# Patient Record
Sex: Male | Born: 1965 | Race: White | Hispanic: No | Marital: Married | Smoking: Former smoker
Health system: Southern US, Community
[De-identification: ages and names within clinical notes are randomized; demographics above are authoritative.]

## PROBLEM LIST (undated history)

## (undated) DIAGNOSIS — K759 Inflammatory liver disease, unspecified: Secondary | ICD-10-CM

## (undated) DIAGNOSIS — F419 Anxiety disorder, unspecified: Secondary | ICD-10-CM

## (undated) DIAGNOSIS — F4310 Post-traumatic stress disorder, unspecified: Secondary | ICD-10-CM

## (undated) DIAGNOSIS — I10 Essential (primary) hypertension: Secondary | ICD-10-CM

## (undated) DIAGNOSIS — M23311 Other meniscus derangements, anterior horn of medial meniscus, right knee: Secondary | ICD-10-CM

## (undated) DIAGNOSIS — R519 Headache, unspecified: Secondary | ICD-10-CM

## (undated) DIAGNOSIS — R51 Headache: Secondary | ICD-10-CM

## (undated) DIAGNOSIS — R7303 Prediabetes: Secondary | ICD-10-CM

## (undated) DIAGNOSIS — M797 Fibromyalgia: Secondary | ICD-10-CM

## (undated) HISTORY — PX: OTHER SURGICAL HISTORY: SHX169

## (undated) HISTORY — PX: CHOLECYSTECTOMY: SHX55

## (undated) HISTORY — PX: BACK SURGERY: SHX140

## (undated) HISTORY — PX: TONSILLECTOMY: SUR1361

## (undated) HISTORY — PX: HERNIA REPAIR: SHX51

---

## 1997-06-23 ENCOUNTER — Emergency Department (HOSPITAL_COMMUNITY): Admission: EM | Admit: 1997-06-23 | Discharge: 1997-06-23 | Payer: Self-pay | Admitting: Emergency Medicine

## 1997-08-24 ENCOUNTER — Emergency Department (HOSPITAL_COMMUNITY): Admission: EM | Admit: 1997-08-24 | Discharge: 1997-08-24 | Payer: Self-pay | Admitting: Emergency Medicine

## 1997-09-07 ENCOUNTER — Emergency Department (HOSPITAL_COMMUNITY): Admission: EM | Admit: 1997-09-07 | Discharge: 1997-09-07 | Payer: Self-pay | Admitting: Emergency Medicine

## 1999-01-14 ENCOUNTER — Encounter: Payer: Self-pay | Admitting: Family Medicine

## 1999-01-14 ENCOUNTER — Encounter: Admission: RE | Admit: 1999-01-14 | Discharge: 1999-01-14 | Payer: Self-pay | Admitting: Family Medicine

## 1999-04-01 ENCOUNTER — Encounter: Admission: RE | Admit: 1999-04-01 | Discharge: 1999-04-01 | Payer: Self-pay | Admitting: Internal Medicine

## 1999-04-29 ENCOUNTER — Encounter: Payer: Self-pay | Admitting: Emergency Medicine

## 1999-04-29 ENCOUNTER — Emergency Department (HOSPITAL_COMMUNITY): Admission: EM | Admit: 1999-04-29 | Discharge: 1999-04-29 | Payer: Self-pay | Admitting: Emergency Medicine

## 2002-01-30 ENCOUNTER — Encounter: Admission: RE | Admit: 2002-01-30 | Discharge: 2002-04-30 | Payer: Self-pay

## 2002-06-20 ENCOUNTER — Encounter
Admission: RE | Admit: 2002-06-20 | Discharge: 2002-09-18 | Payer: Self-pay | Admitting: Physical Medicine & Rehabilitation

## 2002-07-07 ENCOUNTER — Encounter
Admission: RE | Admit: 2002-07-07 | Discharge: 2002-09-29 | Payer: Self-pay | Admitting: Physical Medicine & Rehabilitation

## 2002-12-04 ENCOUNTER — Encounter: Admission: RE | Admit: 2002-12-04 | Discharge: 2002-12-04 | Payer: Self-pay | Admitting: Family Medicine

## 2002-12-04 ENCOUNTER — Encounter: Payer: Self-pay | Admitting: Family Medicine

## 2003-12-05 ENCOUNTER — Emergency Department (HOSPITAL_COMMUNITY): Admission: EM | Admit: 2003-12-05 | Discharge: 2003-12-06 | Payer: Self-pay

## 2003-12-09 ENCOUNTER — Ambulatory Visit (HOSPITAL_COMMUNITY): Admission: RE | Admit: 2003-12-09 | Discharge: 2003-12-09 | Payer: Self-pay | Admitting: Orthopaedic Surgery

## 2003-12-31 ENCOUNTER — Emergency Department (HOSPITAL_COMMUNITY): Admission: EM | Admit: 2003-12-31 | Discharge: 2003-12-31 | Payer: Self-pay | Admitting: Emergency Medicine

## 2004-01-04 ENCOUNTER — Ambulatory Visit (HOSPITAL_COMMUNITY): Admission: RE | Admit: 2004-01-04 | Discharge: 2004-01-05 | Payer: Self-pay | Admitting: Orthopaedic Surgery

## 2004-03-01 ENCOUNTER — Emergency Department (HOSPITAL_COMMUNITY): Admission: EM | Admit: 2004-03-01 | Discharge: 2004-03-01 | Payer: Self-pay | Admitting: Emergency Medicine

## 2004-03-01 ENCOUNTER — Ambulatory Visit (HOSPITAL_COMMUNITY): Admission: RE | Admit: 2004-03-01 | Discharge: 2004-03-01 | Payer: Self-pay | Admitting: Orthopaedic Surgery

## 2004-03-07 ENCOUNTER — Encounter: Admission: RE | Admit: 2004-03-07 | Discharge: 2004-03-31 | Payer: Self-pay | Admitting: Orthopaedic Surgery

## 2004-03-24 ENCOUNTER — Emergency Department (HOSPITAL_COMMUNITY): Admission: EM | Admit: 2004-03-24 | Discharge: 2004-03-24 | Payer: Self-pay | Admitting: Emergency Medicine

## 2006-06-29 ENCOUNTER — Encounter: Admission: RE | Admit: 2006-06-29 | Discharge: 2006-06-29 | Payer: Self-pay | Admitting: Orthopedic Surgery

## 2008-12-23 ENCOUNTER — Ambulatory Visit (HOSPITAL_COMMUNITY): Admission: RE | Admit: 2008-12-23 | Discharge: 2008-12-23 | Payer: Self-pay | Admitting: Psychiatry

## 2008-12-28 ENCOUNTER — Other Ambulatory Visit (HOSPITAL_COMMUNITY): Admission: RE | Admit: 2008-12-28 | Discharge: 2009-01-13 | Payer: Self-pay | Admitting: Psychiatry

## 2008-12-30 ENCOUNTER — Ambulatory Visit: Payer: Self-pay | Admitting: Psychiatry

## 2010-02-04 ENCOUNTER — Ambulatory Visit (HOSPITAL_BASED_OUTPATIENT_CLINIC_OR_DEPARTMENT_OTHER)
Admission: RE | Admit: 2010-02-04 | Discharge: 2010-02-04 | Payer: Self-pay | Source: Home / Self Care | Attending: Family Medicine | Admitting: Family Medicine

## 2010-07-08 NOTE — Op Note (Signed)
Frank Green, Frank Green               ACCOUNT NO.:  [id number]   MEDICAL RECORD NO.:  [id number]          PATIENT TYPE:  OIB   LOCATION:  5005                         FACILITY:  [facility]   PHYSICIAN:  Mark C. Ophelia Charter, M.D.    DATE OF BIRTH:  01-11-1966   DATE OF PROCEDURE:  01/04/2004  DATE OF DISCHARGE:                                 OPERATIVE REPORT   PREOPERATIVE DIAGNOSIS:  Right L4-L5 herniated nucleus pulposus.   POSTOPERATIVE DIAGNOSIS:  Right L4-L5 herniated nucleus pulposus.   PROCEDURE:  Right L4-L5 microdiscectomy and foraminotomy.   SURGEON:  Mark C. Ophelia Charter, M.D.   ASSISTANT:  Sandrea Matte, P.A.-C.   ANESTHESIA:  GOT.   ESTIMATED BLOOD LOSS:  50 mL.   BRIEF HISTORY:  This 45 year old male has had several year history of back  pain and right leg pain.  MRI scan has shown HNP on the right with  degeneration of the 3-4 and 4-5 disc with no compression at the 3-4 level.  He did have some mild central stenosis.  The patient has had right leg pain  only without weakness.   PROCEDURE:  After induction of general anesthesia and orotracheal  intubation, the patient was placed on the Andrews frame, preoperative Ancef  prophylaxis.  The back was prepared with the clippers, shaving the surface  hair.  The area was prepped with DuraPrep, squared with towels, Betadine and  Vi-Drape applied.  Laminectomy sheets and drapes and cross table lateral x-  ray with needle localization at the 4-0 space confirming this was the  appropriate level.  An incision was made 2-3 mm to the right of the midline  directly at the level of the needle with a 5 cm incision.  Subperiosteal  dissection of the lamina was performed.  There was a wide space between the  4 and 5 lamina with a thick interlaminar ligament present.  This was incised  with a scalpel, paddies to protect the dura, and Kerrison rongeur used to  remove the ligament, removed some lateral bone out to the pedicle, enlarge  the foramina.   The disc was not initially visualized in the gutter due to  some displacement of the disc laterally.  There were some epidural veins  which were coagulated and the nerve root was found scarred down to the disc.  The patient had not had epidural steroids and this may have been from his  repetitive bouts of back pain or possibly an epidural vein may have broken  at some point.  In any event, there was scar tissue that required releasing,  this was done with a dural separator, gently immobilizing the nerve root  until it could be pulled toward the midline.  The disc appeared relatively  flat and the annulus was incised.  A small bit of disc material was removed  with the micropituitary and then regular pituitary was inserted and one  large fragment which was 2 by 3 cm was gradually teased out of the small  incision.  A second pituitary had to be used to gradually tease it out  through the small  annular incision.  Once it was pulled out and set down,  there was good freedom of the nerve, it could easily be pulled toward the  midline.  The anterior portion of the epidural space was flat.  Passes were  made with an Epstein curet.  Up and down micropituitaries, straight  pituitary, but once the large piece was removed, there was minimal residual  disc left that was pathologic.  Inspection around the nerve root was  carefully performed, there were no fragments out the  foramina and no free fragments.  After irrigation with saline solution, the  fascia was closed with 0 Vicryl, 2-0 Vicryl in the subcutaneous tissue, skin  staple closure.  Postop dressing was applied.  The patient was transported  to the recovery room in stable condition.  Instrument count and needle  counts were correct.       MCY/MEDQ  D:  01/04/2004  T:  01/04/2004  Job:  [id number]

## 2010-07-08 NOTE — Consult Note (Signed)
NAME:  ARMISTEAD, SULT NO.:  [id number]   MEDICAL RECORD NO.:  [id number]                   PATIENT TYPE:  REC   LOCATION:  TPC                                  FACILITY:  [facility]   PHYSICIAN:  Zachary George, DO                      DATE OF BIRTH:  07-Jun-1965   DATE OF CONSULTATION:  03/25/2002  DATE OF DISCHARGE:                                   CONSULTATION   HISTORY OF PRESENT ILLNESS:  The patient returns to the clinic today for  reevaluation. He was  last seen on February 21, 2002. He continues to do very  well in pool therapy, stating that his pain is fairly well controlled. Most  of his pain now involves his right upper back and right shoulder. He denies  any low back pain at this time. He has not taken Darvocet or a muscle  relaxer in about 1-1/2 weeks and states that he is trying to only use that  medicine essentially as a rescue. 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   He continues using the Lidoderm which has been very helpful. He also takes  low-dose Advil p.r.n. and tolerates it well and states that it does help to  some degree. He is also trying to quit  smoking. He has a lot of financial  concerns at this time because he has not worked in about 2 years.   His pain today is rated as a 5/10 on a subjective scale but improved  overall. His function and quality of life indices have improved overall. We  discussed getting a membership to the [employer] to continue with his pool program  on an independent basis. He is looking into this. I reviewed health and  history form of 14 point review of systems.   PHYSICAL EXAMINATION:  GENERAL:  A healthy appearing male in no acute  distress.  VITAL SIGNS:  Blood pressure 148/69, pulse 77, respirations 20, O2  saturations 99% on room air.  EXTREMITIES:  Examination of the upper extremities does  not reveal any  atrophy. There is full range of motion of the right shoulder actively and  passively. He does have some minimal impingement signs on the right  including minimal pain with empty can and Neer tests. Negative Hawkins  maneuver. There is tenderness to palpation diffusely in the right  parascapular muscles.  NEUROLOGIC:  Examination is intact in the upper extremities including motor,  sensory and reflexes at this time.   IMPRESSION:  1. Myofascial pain syndrome, right upper back.  2. Mild right rotator cuff syndrome.  3. Lower back pain, improved.   PLAN:  1. I instructed the patient to continue with aquatic therapy and transition     to  an independent program and have him consider [employer] for an independent     program.  2. Continue low-dose Advil as needed.  3. Continue Lidoderm patches.  4. Continue Darvocet N-100 and Flexeril just as needed. The patient was     counseled on its use in regards to driving while using these medications.     He understands the risks and states that he will not use the Darvocet or     Flexeril while driving a commercial vehicle.  5. The patient is to return to the clinic in 3 months or sooner as needed.   The patient was educated about the findings and recommendations and  understands.  There were no barriers to communication.                                               Zachary George, DO    JW/MEDQ  D:  03/25/2002  T:  03/25/2002  Job:  [id number]

## 2010-07-08 NOTE — Consult Note (Signed)
NAME:  Frank Green, Frank Green NO.:  [id number]   MEDICAL RECORD NO.:  [id number]                   PATIENT TYPE:  REC   LOCATION:  TPC                                  FACILITY:  [facility]   PHYSICIAN:  Zachary George, DO                      DATE OF BIRTH:  15-Jun-1965   DATE OF CONSULTATION:  01/31/2002  DATE OF DISCHARGE:                                   CONSULTATION   Dear Dr. Nathanial Rancher:   Thank you very much for kindly referring the [facility] to [facility].  The patient was evaluated today  Regarding History and Physical examination and treatment recommendations,  once again thank you for allowing [place] to participate in the care of this  patient.   CHIEF COMPLAINT:  Low back pain, upper back pain, upper shoulder pain.   HISTORY OF PRESENT ILLNESS:  The patient is a pleasant 45 year old right-  hand dominant male who complains of low back pain radiating into his right  buttock and proximal lateral thigh, occasionally running up to his neck,  giving me a serious migraine.  He states he has a herniated disk in his  low back. 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  He was covered by [employer] at that time.  He was  followed by Dr. Daphine Deutscher at [facility] as well as Dr. Montez Morita and Dr. Chaney Malling,  both orthopedic surgeons here in [place].  He was not determined to be a  surgical candidate and was referred for a work Product manager.  During  he work Product manager, he states he hurt his low back.  An MRI  performed on 09/06/2000 revealed a right lateral L4-5 herniated disk with  disk dessication at L3-4.  He was treated with oral prednisone with minimal  temporary relief.  He was then followed by Dr. Francesca Jewett and underwent a work  hardening program and along the way was diagnosed with  fibromyalgia syndrome  by Dr. Francesca Jewett.  He has been treated with various medications including  multiple nonsteroidal anti-inflammatory medications including COX2  inhibitors which he did not tolerate secondary to GI upset.  He has also  tried Ultram which did not help and Ultracet which caused an upset stomach.  He currently takes Darvocet 3 per day which he states helps him function and  decreases his pain to tolerable levels.  He also take amitriptyline at  bedtime.  He has been treated with Neurontin up to 2400 mg per day, but he  states that it caused him significant nausea, and this has been decreased  down to 300 mg per day with equivocal effect.   His pain today is 8/10 on subjective scale.  The patient  states that he has  been off of his Darvocet for two days so that I could get an accurate  assessment of his pain level.  His function and quality of life indices have  declined.  He describes his pain as constant, dull, sharp, with associated  weakness in the right lower extremity.  His symptoms are worse with bending,  sitting, working, and improved with rest.  He notes occasional increase in  his pain with coughing and bowel movement as well.  I reviewed records which  he brought with him today.  He had a functional capacity evaluation early  2003 which recommended medium to heavy physical demand job level, although  the patient states at this point he does not feel like he can perform at  that level.  He actually tried to work back in November and was only able to  last approximately two nights secondary to increased pain in his lower back.  In terms of his right shoulder, he states that his pain has been very  tolerable and manageable in that regard.  He denies bowel or bladder  dysfunction.  He denies fever, chills, night sweats, weight loss.  I  reviewed the health and history form and 14-point Review of Systems.  Again,  function and quality of life indices have declined.    The patient admits to anterior chest wall and muscle pain occasionally.  He  also admits to some dizziness and occasional headaches.  He admits to a  nervous disorder.  He is somewhat fearful of needles as he states he had  hepatitis as a child and was hospitalized requiring multiple needle sticks.  He has also had some injections into his shoulder and tolerated it well,  although again he does not particularly like needles.   PAST MEDICAL HISTORY:  Hypertension.   PAST SURGICAL HISTORY:  1. Bilateral herniorrhaphy.  2. Neck surgery to remove swallowed glass as a child.   FAMILY HISTORY:  Noncontributory.   SOCIAL HISTORY:  The patient smokes one pack of cigarettes a day.  I  counseled him on the importance of smoking cessation in terms of pain and  overall health.  Denies alcohol use.  He denies illicit drug use.  He is  married and has three children, ages 23, 64, and 2.  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   ALLERGIES:  ASPIRIN and intolerance to ANTI-INFLAMMATORY MEDICATIONS.   MEDICATIONS:  1. Atenolol.  2. Neurontin 300 mg q.d.  3. Amitriptyline 25 mg at bedtime.  4. Protonix.  5. Prednisone taper for a dermatologic condition.  6. Darvocet-N 100 t.i.d. as needed.  The patient has 13 pills remaining on     his previous prescription.  7. Flexeril b.i.d. as needed.   PHYSICAL EXAMINATION:  GENERAL:  Healthy male in no acute distress.  VITAL SIGNS:  Blood pressure 139/80, pulse 78, respirations 81, O2  saturation 96% on room air.  NEUROLOGIC:  Mood is appropriate. Affect is appropriate as well.  The  patient does seem slightly anxious.  Examination of the back reveals level  pelvis without scoliosis.  There is mildly decreased lumbar lordosis.  Range  of motion of the lumbar spine is functional but painful with flexion,  extension, side bending, rotation, as well as rotation plus extension. There is tenderness to palpation  bilateral lumbar paraspinal muscles.  Manual muscle testing is 5/5 bilateral lower extremities.  Sensory exam is  intact to  light touch bilateral lower extremities in all dermatomal  distributions at this time.  Muscle stretch reflexes are 2+/4 bilateral  patella, medial hamstrings, and Achilles.  No ankle clonus noted  bilaterally.  Straight leg raise is negative bilaterally.  Fabere is  negative bilaterally.  The patient has tight hamstrings and hip flexors  bilaterally.  No heat, erythema, or edema in the lower extremities.   LABORATORY DATA:  MRI of the lumbar spine is reviewed.  This is dated  09/06/2000.  It reveals a large right lateral L4-5 herniated nucleus pulposus  with neural foraminal stenosis.  There was also disk dessication at L3-4.   IMPRESSION:  1. 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  2. Lumbar disk herniation L4-5.  3. Chronic low back pain with right lower extremity radicular symptoms.  4. History of fibromyalgia syndrome.  The patient does not complain of     significant diffuse pain at this time.   PLAN:  1. Discussed treatment options with the patient.  These include medical     management as well as interventional procedures to help his pain.  The     patient discusses his desire to come off of some of the medications     including the Darvocet at some point, although he states that it is     making him fairly functional.  I discussed the importance of functional     restoration with the patient, and he understands having been through a     work Product manager.  I instructed him to continue with his home-     based exercise programs for lumbar stabilization exercises which he is     currently doing somewhat inconsistently.  I also recommended a trial of     lumbar epidural steroid injections to try to decrease the patient's pain     and to also minimize escalation of medications as again, patient wishes     to ultimately come off  of some of these pain medications.  The patient is     somewhat reluctant to proceed with minimally invasive interventional     procedures such as a lumbar epidural steroid injection and in this case,     we will continue with his current medications at this time.  Would     continue Darvocet 3 times per day as needed but eventually would like to     search for non-narcotic alternatives.  Would consider a trial of Lidoderm     patches.  In terms of patient's radicular symptoms, I will have him     discontinue Neurontin as I do not feel that 300 mg per day is really     going to make much difference in terms of his radicular component.  I     will start Zonegran 100 mg 1 p.o. q.d., #14 sample pack given.  2. Change Flexeril to b.i.d. as needed only.  Would like to ultimately     discontinue this as it is typically not indicated for chronic back pain.  3. The patient is to return to clinic in two weeks for reevaluation. 4. Maintain contact with primary care Jodine Muchmore.  5. If the patient decides against any interventional procedures and the     Zonegran seems to be helping him, will likely release back to the primary     care Jacari Kirsten.   The patient was educated about findings and recommendations and understands.  No barriers to communication.  Zachary George, DO    JW/MEDQ  D:  01/31/2002  T:  02/01/2002  Job:  [id number]   cc:   Burnell Blanks, M.D.  [address]  [place], [place]

## 2010-07-08 NOTE — Consult Note (Signed)
NAME:  Frank Green, Frank Green NO.:  [id number]   MEDICAL RECORD NO.:  [id number]                   PATIENT TYPE:  REC   LOCATION:  TPC                                  FACILITY:  [facility]   PHYSICIAN:  Zachary George, DO                      DATE OF BIRTH:  1965-05-26   DATE OF CONSULTATION:  02/21/2002  DATE OF DISCHARGE:                                   CONSULTATION   REASON FOR CONSULTATION:  The patient returns to clinic today for re-  evaluation.  He was initially seen on 01/31/02.  Today he complains mainly  of right upper back and right anterior chest wall pain.  He denies any  significant low back pain or radicular symptoms at this time.  His pain  today is a 5/10 on a subjective scale.  He continues on Darvocet three pills  per day and has 15 pills remaining.  He also continues on Flexeril 10 mg  b.i.d. with 16 pills remaining.  He does not really like taking the  medication, but it does seem to help him in term of his pain and functional  abilities.  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  I reviewed health and history form and 14 point review  of systems.   PHYSICAL EXAMINATION:  GENERAL:  A healthy male in no acute distress.  VITAL SIGNS:  Blood pressure 136/88, pulse 71, respirations 16, O2  saturation 97% on room air.  BACK:  Active trigger points in the right upper trapezius, levator scapula,  and pectoralis major muscle.  Palpation of these trigger points seems to  reproduce the patient's pain.  The patient does not have any significant  tenderness to palpation to the lumbar paraspinous, and has functional range  of motion of the lumbar spine at this time.  Range of motion of the cervical  spine is full in all planes without significant discomfort.   Spurling  maneuver is negative bilaterally.  NEUROLOGIC:  The upper and lower extremities are intact to motor, sensory,  and reflexes at this time.   IMPRESSION:  1. Myofascial pain syndrome, right upper back and anterior chest wall with     above noted trigger points.  2. Degenerative disk disease of the lumbar spine.  3. Low back pain, significantly improved.   PLAN:  1. I discussed treatment options with the patient.  At this time, I would     like to start him in physical therapy for range of motion sprain and     stretch of his active trigger points, myofascial release techniques,     muscle re-education,  and a home exercise program two to three times per     week for four weeks.  The patient has had extensive therapy in the past,     however, this was primarily for his lower back and did not have any the     sprain stretch techniques for his trigger points.  I explained this to     him in detail, and he understands the rationale.  2. Continue Darvocet-N 100 one p.o. t.i.d. p.r.n. #90 without refills.     Eventually, would like to wean him from the Darvocet-N 100 and seek non-     narcotic alternatives, although many of these have been unsuccessful in     the past.  3. Continue Flexeril 10 mg one p.o. b.i.d. p.r.n. #60 with one refill.  4. We will begin a trial of Lidoderm 5% apply for 12 hours per day, maximum     of three patches at a time #30 with one refill to apply to his upper back     and anterior chest wall.  5. The patient is to return to clinic in one months for re-evaluation.  If     the patient's symptoms are not improving, would consider a trial of     trigger point injections.  We will continue to monitor the patient in     terms of his low back pain.   The patient was educated on the above findings and recommendations and  understands.  There were no barriers to communication.                                               Zachary George, DO    JW/MEDQ  D:   02/21/2002  T:  02/22/2002  Job:  [id number]   cc:   Burnell Blanks, M.D.  [phone]  [place], [place]

## 2010-08-22 ENCOUNTER — Ambulatory Visit: Payer: Self-pay | Admitting: Internal Medicine

## 2010-10-13 ENCOUNTER — Ambulatory Visit: Payer: Self-pay | Admitting: Internal Medicine

## 2014-08-13 ENCOUNTER — Other Ambulatory Visit: Payer: Self-pay | Admitting: Orthopaedic Surgery

## 2014-08-13 DIAGNOSIS — M545 Low back pain: Secondary | ICD-10-CM

## 2014-08-27 ENCOUNTER — Ambulatory Visit
Admission: RE | Admit: 2014-08-27 | Discharge: 2014-08-27 | Disposition: A | Discharge status: Home / Self Care | Payer: 59 | Source: Ambulatory Visit | Attending: Orthopaedic Surgery | Admitting: Orthopaedic Surgery

## 2014-08-27 DIAGNOSIS — M545 Low back pain: Secondary | ICD-10-CM

## 2014-08-27 MED ORDER — GADOBENATE DIMEGLUMINE 529 MG/ML IV SOLN
19.0000 mL | Freq: Once | INTRAVENOUS | Status: AC | PRN
  Administered 2014-08-27: 19 mL via INTRAVENOUS

## 2014-09-02 ENCOUNTER — Other Ambulatory Visit (HOSPITAL_COMMUNITY): Payer: Self-pay | Admitting: Orthopaedic Surgery

## 2014-09-16 ENCOUNTER — Encounter (HOSPITAL_COMMUNITY)
Admission: RE | Admit: 2014-09-16 | Discharge: 2014-09-16 | Disposition: A | Discharge status: Home / Self Care | Payer: 59 | Source: Ambulatory Visit | Attending: Orthopaedic Surgery | Admitting: Orthopaedic Surgery

## 2014-09-16 ENCOUNTER — Encounter (HOSPITAL_COMMUNITY): Payer: Self-pay

## 2014-09-16 ENCOUNTER — Ambulatory Visit (HOSPITAL_COMMUNITY)
Admission: RE | Admit: 2014-09-16 | Discharge: 2014-09-16 | Disposition: A | Discharge status: Home / Self Care | Payer: 59 | Source: Ambulatory Visit | Attending: Orthopaedic Surgery | Admitting: Orthopaedic Surgery

## 2014-09-16 DIAGNOSIS — I10 Essential (primary) hypertension: Secondary | ICD-10-CM | POA: Insufficient documentation

## 2014-09-16 DIAGNOSIS — M5126 Other intervertebral disc displacement, lumbar region: Secondary | ICD-10-CM

## 2014-09-16 DIAGNOSIS — I498 Other specified cardiac arrhythmias: Secondary | ICD-10-CM | POA: Diagnosis not present

## 2014-09-16 DIAGNOSIS — Z01812 Encounter for preprocedural laboratory examination: Secondary | ICD-10-CM | POA: Diagnosis not present

## 2014-09-16 DIAGNOSIS — Z01818 Encounter for other preprocedural examination: Secondary | ICD-10-CM | POA: Diagnosis not present

## 2014-09-16 HISTORY — DX: Essential (primary) hypertension: I10

## 2014-09-16 HISTORY — DX: Inflammatory liver disease, unspecified: K75.9

## 2014-09-16 HISTORY — DX: Fibromyalgia: M79.7

## 2014-09-16 HISTORY — DX: Headache: R51

## 2014-09-16 HISTORY — DX: Headache, unspecified: R51.9

## 2014-09-16 HISTORY — DX: Anxiety disorder, unspecified: F41.9

## 2014-09-16 LAB — CBC
HCT: 43.8 % (ref 39.0–52.0)
HEMOGLOBIN: 15.1 g/dL (ref 13.0–17.0)
MCH: 29 pg (ref 26.0–34.0)
MCHC: 34.5 g/dL (ref 30.0–36.0)
MCV: 84.1 fL (ref 78.0–100.0)
Platelets: 246 10*3/uL (ref 150–400)
RBC: 5.21 MIL/uL (ref 4.22–5.81)
RDW: 14 % (ref 11.5–15.5)
WBC: 7.3 10*3/uL (ref 4.0–10.5)

## 2014-09-16 LAB — COMPREHENSIVE METABOLIC PANEL
ALBUMIN: 4.4 g/dL (ref 3.5–5.0)
ALT: 59 U/L (ref 17–63)
ANION GAP: 8 (ref 5–15)
AST: 28 U/L (ref 15–41)
Alkaline Phosphatase: 31 U/L — ABNORMAL LOW (ref 38–126)
BUN: 14 mg/dL (ref 6–20)
CALCIUM: 9.7 mg/dL (ref 8.9–10.3)
CHLORIDE: 100 mmol/L — AB (ref 101–111)
CO2: 28 mmol/L (ref 22–32)
CREATININE: 0.86 mg/dL (ref 0.61–1.24)
GFR calc Af Amer: >60 mL/min (ref >60)
GFR calc non Af Amer: >60 mL/min (ref >60)
GLUCOSE: 107 mg/dL — AB (ref 65–99)
POTASSIUM: 4.3 mmol/L (ref 3.5–5.1)
Sodium: 136 mmol/L (ref 135–145)
Total Bilirubin: 0.6 mg/dL (ref 0.3–1.2)
Total Protein: 7.6 g/dL (ref 6.5–8.1)

## 2014-09-16 LAB — SURGICAL PCR SCREEN
MRSA, PCR: NEGATIVE
Staphylococcus aureus: NEGATIVE

## 2014-09-16 LAB — PROTIME-INR
INR: 0.99 (ref 0.00–1.49)
Prothrombin Time: 13.3 seconds (ref 11.6–15.2)

## 2014-09-16 NOTE — Progress Notes (Signed)
PCP is Dr Windle Guard Denies seeing a Cardiologist. Denies ever having a stress test, echo, or card cath. Pt reports he used to be diabetic, but by watching his diet he no longer takes meds for his DM. He reports he does check his blood sugars at random- reports between 70-120 He had an AIC done,but doesn't remember how long ago it was, but it was 7.3. Ordered an A1c for today.

## 2014-09-16 NOTE — Pre-Procedure Instructions (Addendum)
Frank Green  09/16/2014      [facility] [address] [place], [place] - [address] [address] [place] [address] Phone: [phone] Fax: [phone]    Your procedure is scheduled on Aug 5.  Report to [facility] Admitting at  [phone].  Call this number if you have problems the morning of surgery:  [phone]   Remember:  Do not eat food or drink liquids after midnight.  Take these medicines the morning of surgery with A SIP OF WATER : atenolol(Tenormin), Oxycodone-acetaminophen( Percocet) if needed OR Vicodin if needed  Stop taking aspirin, Ibuprofen, Aleve, BC's, Goody's, Herbal medication, Fish Oil   Do not wear jewelry, make-up or nail polish.  Do not wear lotions, powders, or perfumes.  You may wear deodorant.  Do not shave 48 hours prior to surgery.  Men may shave face and neck.  Do not bring valuables to the hospital.  [facility] is not responsible for any belongings or valuables.  Contacts, dentures or bridgework may not be worn into surgery.  Leave your suitcase in the car.  After surgery it may be brought to your room.  For patients admitted to the hospital, discharge time will be determined by your treatment team.  Patients discharged the day of surgery will not be allowed to drive home.    Special instructions: Frank Green - Preparing for Surgery  Before surgery, you can play an important role.  Because skin is not sterile, your skin needs to be as free of germs as possible.  You can reduce the number of germs on you skin by washing with CHG (chlorahexidine gluconate) soap before surgery.  CHG is an antiseptic cleaner which kills germs and bonds with the skin to continue killing germs even after washing.  Please DO NOT use if you have an allergy to CHG or antibacterial soaps.  If your skin becomes reddened/irritated stop using the CHG and inform your nurse when you arrive at Short Stay.  Do not shave (including legs  and underarms) for at least 48 hours prior to the first CHG shower.  You may shave your face.  Please follow these instructions carefully:   1.  Shower with CHG Soap the night before surgery and the    morning of Surgery.  2.  If you choose to wash your hair, wash your hair first as usual with your   normal shampoo.  3.  After you shampoo, rinse your hair and body thoroughly to remove the    Shampoo.  4.  Use CHG as you would any other liquid soap.  You can apply chg directly  to the skin and wash gently with scrungie or a clean washcloth.  5.  Apply the CHG Soap to your body ONLY FROM THE NECK DOWN.   Do not use on open wounds or open sores.  Avoid contact with your eyes, ears, mouth and genitals (private parts).  Wash genitals (private parts)       with your normal soap.  6.  Wash thoroughly, paying special attention to the area where your surgery   will be performed.  7.  Thoroughly rinse your body with warm water from the neck down.  8.  DO NOT shower/wash with your normal soap after using and rinsing off   the CHG Soap.  9.  Pat yourself dry with a clean towel.  10.  Wear clean pajamas.            11.  Place clean sheets on your bed the night of your first shower and do not        sleep with pets.  Day of Surgery  Do not apply any lotions/deoderants the morning of surgery.  Please wear clean clothes to the hospital/surgery center.     Please read over the following fact sheets that you were given. Pain Booklet, Coughing and Deep Breathing, MRSA Information and Surgical Site Infection Prevention

## 2014-09-17 LAB — HEMOGLOBIN A1C
Hgb A1c MFr Bld: 6.1 % — ABNORMAL HIGH (ref 4.8–5.6)
MEAN PLASMA GLUCOSE: 128 mg/dL

## 2014-09-25 ENCOUNTER — Encounter (HOSPITAL_COMMUNITY)
Admission: RE | Disposition: A | Discharge status: Home / Self Care | Payer: Self-pay | Source: Ambulatory Visit | Attending: Orthopaedic Surgery

## 2014-09-25 ENCOUNTER — Ambulatory Visit (HOSPITAL_COMMUNITY): Payer: 59 | Admitting: Certified Registered"

## 2014-09-25 ENCOUNTER — Ambulatory Visit (HOSPITAL_COMMUNITY): Payer: 59

## 2014-09-25 ENCOUNTER — Encounter (HOSPITAL_COMMUNITY): Payer: Self-pay | Admitting: Certified Registered"

## 2014-09-25 ENCOUNTER — Observation Stay (HOSPITAL_COMMUNITY)
Admission: RE | Admit: 2014-09-25 | Discharge: 2014-09-26 | Disposition: A | Discharge status: Home / Self Care | Payer: 59 | Source: Ambulatory Visit | Attending: Orthopaedic Surgery | Admitting: Orthopaedic Surgery

## 2014-09-25 DIAGNOSIS — Z9889 Other specified postprocedural states: Secondary | ICD-10-CM

## 2014-09-25 DIAGNOSIS — E119 Type 2 diabetes mellitus without complications: Secondary | ICD-10-CM | POA: Insufficient documentation

## 2014-09-25 DIAGNOSIS — I10 Essential (primary) hypertension: Secondary | ICD-10-CM | POA: Diagnosis not present

## 2014-09-25 DIAGNOSIS — K759 Inflammatory liver disease, unspecified: Secondary | ICD-10-CM | POA: Insufficient documentation

## 2014-09-25 DIAGNOSIS — F419 Anxiety disorder, unspecified: Secondary | ICD-10-CM | POA: Diagnosis not present

## 2014-09-25 DIAGNOSIS — M5116 Intervertebral disc disorders with radiculopathy, lumbar region: Secondary | ICD-10-CM | POA: Diagnosis not present

## 2014-09-25 DIAGNOSIS — F4310 Post-traumatic stress disorder, unspecified: Secondary | ICD-10-CM | POA: Diagnosis not present

## 2014-09-25 DIAGNOSIS — Z87891 Personal history of nicotine dependence: Secondary | ICD-10-CM | POA: Diagnosis not present

## 2014-09-25 DIAGNOSIS — M797 Fibromyalgia: Secondary | ICD-10-CM | POA: Diagnosis not present

## 2014-09-25 DIAGNOSIS — Z419 Encounter for procedure for purposes other than remedying health state, unspecified: Secondary | ICD-10-CM

## 2014-09-25 DIAGNOSIS — K219 Gastro-esophageal reflux disease without esophagitis: Secondary | ICD-10-CM | POA: Diagnosis not present

## 2014-09-25 HISTORY — PX: LUMBAR LAMINECTOMY/DECOMPRESSION MICRODISCECTOMY: SHX5026

## 2014-09-25 LAB — GLUCOSE, CAPILLARY
GLUCOSE-CAPILLARY: 111 mg/dL — AB (ref 65–99)
GLUCOSE-CAPILLARY: 135 mg/dL — AB (ref 65–99)
GLUCOSE-CAPILLARY: 150 mg/dL — AB (ref 65–99)
Glucose-Capillary: 190 mg/dL — ABNORMAL HIGH (ref 65–99)

## 2014-09-25 SURGERY — LUMBAR LAMINECTOMY/DECOMPRESSION MICRODISCECTOMY
Anesthesia: General | Laterality: Right

## 2014-09-25 MED ORDER — OXYCODONE-ACETAMINOPHEN 7.5-325 MG PO TABS: 1.0000 | ORAL_TABLET | Freq: Four times a day (QID) | ORAL | Status: DC | PRN

## 2014-09-25 MED ORDER — ACETAMINOPHEN 325 MG PO TABS
ORAL_TABLET | ORAL | Status: DC | PRN
  Administered 2014-09-25: 1000 mg via ORAL

## 2014-09-25 MED ORDER — PROPOFOL 10 MG/ML IV BOLUS: INTRAVENOUS | Status: DC | PRN

## 2014-09-25 MED ORDER — CEFAZOLIN SODIUM-DEXTROSE 2-3 GM-% IV SOLR
INTRAVENOUS | Status: AC
  Administered 2014-09-25: 2 g via INTRAVENOUS
  Filled 2014-09-25: qty 50

## 2014-09-25 MED ORDER — HYDROMORPHONE HCL 1 MG/ML IJ SOLN
INTRAMUSCULAR | Status: AC
  Filled 2014-09-25: qty 1

## 2014-09-25 MED ORDER — POTASSIUM CHLORIDE IN NACL 20-0.45 MEQ/L-% IV SOLN
INTRAVENOUS | Status: DC
  Filled 2014-09-25 (×2): qty 1000

## 2014-09-25 MED ORDER — MENTHOL 3 MG MT LOZG: 1.0000 | LOZENGE | OROMUCOSAL | Status: DC | PRN

## 2014-09-25 MED ORDER — PROPOFOL 10 MG/ML IV BOLUS
INTRAVENOUS | Status: AC
  Filled 2014-09-25: qty 20

## 2014-09-25 MED ORDER — KETOROLAC TROMETHAMINE 30 MG/ML IJ SOLN
INTRAMUSCULAR | Status: DC | PRN
  Administered 2014-09-25: 30 mg via INTRAVENOUS

## 2014-09-25 MED ORDER — SUCCINYLCHOLINE CHLORIDE 20 MG/ML IJ SOLN
INTRAMUSCULAR | Status: DC | PRN
  Administered 2014-09-25: 100 mg via INTRAVENOUS

## 2014-09-25 MED ORDER — ACETAMINOPHEN 650 MG RE SUPP
650.0000 mg | RECTAL | Status: DC | PRN
  Filled 2014-09-25: qty 1

## 2014-09-25 MED ORDER — METHOCARBAMOL 1000 MG/10ML IJ SOLN
500.0000 mg | Freq: Four times a day (QID) | INTRAVENOUS | Status: DC | PRN
  Filled 2014-09-25: qty 5

## 2014-09-25 MED ORDER — ONDANSETRON HCL 4 MG/2ML IJ SOLN
INTRAMUSCULAR | Status: DC | PRN
  Administered 2014-09-25: 8 mg via INTRAVENOUS

## 2014-09-25 MED ORDER — OXYCODONE-ACETAMINOPHEN 5-325 MG PO TABS
ORAL_TABLET | ORAL | Status: AC
  Filled 2014-09-25: qty 2

## 2014-09-25 MED ORDER — HYDROMORPHONE HCL 1 MG/ML IJ SOLN: 0.5000 mg | INTRAMUSCULAR | Status: DC | PRN

## 2014-09-25 MED ORDER — 0.9 % SODIUM CHLORIDE (POUR BTL) OPTIME
TOPICAL | Status: DC | PRN
  Administered 2014-09-25: 1000 mL

## 2014-09-25 MED ORDER — FENTANYL CITRATE (PF) 250 MCG/5ML IJ SOLN
INTRAMUSCULAR | Status: AC
  Filled 2014-09-25: qty 5

## 2014-09-25 MED ORDER — ATENOLOL 50 MG PO TABS
100.0000 mg | ORAL_TABLET | Freq: Every day | ORAL | Status: DC
  Administered 2014-09-26: 100 mg via ORAL
  Filled 2014-09-25: qty 2

## 2014-09-25 MED ORDER — ACETAMINOPHEN 325 MG PO TABS: 650.0000 mg | ORAL_TABLET | ORAL | Status: DC | PRN

## 2014-09-25 MED ORDER — PHENOL 1.4 % MT LIQD: 1.0000 | OROMUCOSAL | Status: DC | PRN

## 2014-09-25 MED ORDER — MIDAZOLAM HCL 2 MG/2ML IJ SOLN
INTRAMUSCULAR | Status: AC
  Filled 2014-09-25: qty 4

## 2014-09-25 MED ORDER — ONDANSETRON HCL 4 MG/2ML IJ SOLN: 4.0000 mg | INTRAMUSCULAR | Status: DC | PRN

## 2014-09-25 MED ORDER — ACETAMINOPHEN 500 MG PO TABS
ORAL_TABLET | ORAL | Status: AC
  Filled 2014-09-25: qty 3

## 2014-09-25 MED ORDER — LACTATED RINGERS IV SOLN
INTRAVENOUS | Status: DC
  Administered 2014-09-25: 11:00:00 via INTRAVENOUS
  Administered 2014-09-25: 50 mL/h via INTRAVENOUS
  Administered 2014-09-25: 12:00:00 via INTRAVENOUS

## 2014-09-25 MED ORDER — BUPIVACAINE HCL (PF) 0.25 % IJ SOLN
INTRAMUSCULAR | Status: DC | PRN
  Administered 2014-09-25: 10 mL

## 2014-09-25 MED ORDER — OXYCODONE-ACETAMINOPHEN 5-325 MG PO TABS
1.0000 | ORAL_TABLET | ORAL | Status: DC | PRN
  Administered 2014-09-25 – 2014-09-26 (×4): 2 via ORAL
  Filled 2014-09-25 (×3): qty 2

## 2014-09-25 MED ORDER — FENTANYL CITRATE (PF) 100 MCG/2ML IJ SOLN: 25.0000 ug | INTRAMUSCULAR | Status: DC | PRN

## 2014-09-25 MED ORDER — MIDAZOLAM HCL 5 MG/5ML IJ SOLN
INTRAMUSCULAR | Status: DC | PRN
  Administered 2014-09-25: 2 mg via INTRAVENOUS

## 2014-09-25 MED ORDER — SODIUM CHLORIDE 0.9 % IV SOLN: 250.0000 mL | INTRAVENOUS | Status: DC

## 2014-09-25 MED ORDER — FENTANYL CITRATE (PF) 100 MCG/2ML IJ SOLN
INTRAMUSCULAR | Status: DC | PRN
  Administered 2014-09-25: 150 ug via INTRAVENOUS
  Administered 2014-09-25: 100 ug via INTRAVENOUS

## 2014-09-25 MED ORDER — SODIUM CHLORIDE 0.9 % IJ SOLN: 3.0000 mL | Freq: Two times a day (BID) | INTRAMUSCULAR | Status: DC

## 2014-09-25 MED ORDER — SODIUM CHLORIDE 0.9 % IJ SOLN: 3.0000 mL | INTRAMUSCULAR | Status: DC | PRN

## 2014-09-25 MED ORDER — METHOCARBAMOL 500 MG PO TABS: 500.0000 mg | ORAL_TABLET | Freq: Four times a day (QID) | ORAL | Status: DC | PRN

## 2014-09-25 MED ORDER — PROPOFOL 10 MG/ML IV BOLUS
INTRAVENOUS | Status: DC | PRN
  Administered 2014-09-25: 20 mg via INTRAVENOUS
  Administered 2014-09-25: 180 mg via INTRAVENOUS

## 2014-09-25 MED ORDER — METHOCARBAMOL 500 MG PO TABS
500.0000 mg | ORAL_TABLET | Freq: Four times a day (QID) | ORAL | Status: DC | PRN
  Administered 2014-09-25 – 2014-09-26 (×2): 500 mg via ORAL
  Filled 2014-09-25 (×3): qty 1

## 2014-09-25 MED ORDER — DEXAMETHASONE SODIUM PHOSPHATE 4 MG/ML IJ SOLN
INTRAMUSCULAR | Status: DC | PRN
  Administered 2014-09-25 (×2): 4 mg via INTRAVENOUS

## 2014-09-25 MED ORDER — LIDOCAINE HCL (CARDIAC) 20 MG/ML IV SOLN
INTRAVENOUS | Status: DC | PRN
  Administered 2014-09-25: 80 mg via INTRAVENOUS

## 2014-09-25 SURGICAL SUPPLY — 50 items
ADH SKN CLS APL DERMABOND .7 (GAUZE/BANDAGES/DRESSINGS) ×1
APL SKNCLS STERI-STRIP NONHPOA (GAUZE/BANDAGES/DRESSINGS) ×1
BENZOIN TINCTURE PRP APPL 2/3 (GAUZE/BANDAGES/DRESSINGS) ×2 IMPLANT
BUR ROUND FLUTED 4 SOFT TCH (BURR) ×1 IMPLANT
BUR ROUND FLUTED 4MM SOFT TCH (BURR) ×1
CLOSURE STERI-STRIP 1/2X4 (GAUZE/BANDAGES/DRESSINGS) ×1
CLSR STERI-STRIP ANTIMIC 1/2X4 (GAUZE/BANDAGES/DRESSINGS) ×2 IMPLANT
COVER SURGICAL LIGHT HANDLE (MISCELLANEOUS) ×3 IMPLANT
DECANTER SPIKE VIAL GLASS SM (MISCELLANEOUS) ×2 IMPLANT
DERMABOND ADVANCED (GAUZE/BANDAGES/DRESSINGS) ×2
DERMABOND ADVANCED .7 DNX12 (GAUZE/BANDAGES/DRESSINGS) ×1 IMPLANT
DRAPE MICROSCOPE LEICA (MISCELLANEOUS) ×3 IMPLANT
DRAPE PROXIMA HALF (DRAPES) ×6 IMPLANT
DRSG MEPILEX BORDER 4X4 (GAUZE/BANDAGES/DRESSINGS) ×3 IMPLANT
DURAPREP 26ML APPLICATOR (WOUND CARE) ×3 IMPLANT
ELECT REM PT RETURN 9FT ADLT (ELECTROSURGICAL) ×3
ELECTRODE REM PT RTRN 9FT ADLT (ELECTROSURGICAL) ×1 IMPLANT
GLOVE BIOGEL PI IND STRL 7.0 (GLOVE) IMPLANT
GLOVE BIOGEL PI IND STRL 8 (GLOVE) ×2 IMPLANT
GLOVE BIOGEL PI INDICATOR 7.0 (GLOVE) ×2
GLOVE BIOGEL PI INDICATOR 8 (GLOVE) ×4
GLOVE ORTHO TXT STRL SZ7.5 (GLOVE) ×6 IMPLANT
GLOVE SURG SS PI 6.5 STRL IVOR (GLOVE) ×2 IMPLANT
GOWN STRL REUS W/ TWL LRG LVL3 (GOWN DISPOSABLE) ×2 IMPLANT
GOWN STRL REUS W/ TWL XL LVL3 (GOWN DISPOSABLE) ×1 IMPLANT
GOWN STRL REUS W/TWL 2XL LVL3 (GOWN DISPOSABLE) ×3 IMPLANT
GOWN STRL REUS W/TWL LRG LVL3 (GOWN DISPOSABLE) ×6
GOWN STRL REUS W/TWL XL LVL3 (GOWN DISPOSABLE) ×3
KIT BASIN OR (CUSTOM PROCEDURE TRAY) ×3 IMPLANT
KIT ROOM TURNOVER OR (KITS) ×3 IMPLANT
MANIFOLD NEPTUNE II (INSTRUMENTS) ×3 IMPLANT
NDL HYPO 25GX1X1/2 BEV (NEEDLE) ×1 IMPLANT
NDL SPNL 18GX3.5 QUINCKE PK (NEEDLE) ×1 IMPLANT
NEEDLE HYPO 25GX1X1/2 BEV (NEEDLE) ×3 IMPLANT
NEEDLE SPNL 18GX3.5 QUINCKE PK (NEEDLE) ×3 IMPLANT
NS IRRIG 1000ML POUR BTL (IV SOLUTION) ×3 IMPLANT
PACK LAMINECTOMY ORTHO (CUSTOM PROCEDURE TRAY) ×3 IMPLANT
PAD ARMBOARD 7.5X6 YLW CONV (MISCELLANEOUS) ×6 IMPLANT
PATTIES SURGICAL .5 X.5 (GAUZE/BANDAGES/DRESSINGS) IMPLANT
PATTIES SURGICAL .75X.75 (GAUZE/BANDAGES/DRESSINGS) ×2 IMPLANT
POSITIONER HEAD PRONE TRACH (MISCELLANEOUS) ×2 IMPLANT
SUT BONE WAX W31G (SUTURE) ×2 IMPLANT
SUT VIC AB 0 CT1 27 (SUTURE) ×3
SUT VIC AB 0 CT1 27XBRD ANBCTR (SUTURE) ×1 IMPLANT
SUT VIC AB 2-0 CT1 27 (SUTURE) ×3
SUT VIC AB 2-0 CT1 TAPERPNT 27 (SUTURE) ×1 IMPLANT
SUT VIC AB 3-0 X1 27 (SUTURE) IMPLANT
TOWEL OR 17X24 6PK STRL BLUE (TOWEL DISPOSABLE) ×3 IMPLANT
TOWEL OR 17X26 10 PK STRL BLUE (TOWEL DISPOSABLE) ×3 IMPLANT
WATER STERILE IRR 1000ML POUR (IV SOLUTION) ×3 IMPLANT

## 2014-09-25 NOTE — H&P (Signed)
Frank Green is an 49 y.o. male.   The patient returns.  He states his pain is severe.  He got some Vicodin and he has already taken half the bottle from his multiple physicians since he lives in [place].  He states it is not helping the pain.  He is having to ambulate with a cane since his leg is painful and giving way.  He has numbness on the dorsum of his foot.  He describes it as an Lobbyist shot, severe.  He states when he was in the shower he felt a pop.  He states he was not sure if he was going to be able to get out of the shower.  The pain has been significantly worse.  This was just before his MRI scan.  The patient still uses atenolol.  He has had Robaxin.  The patient has been followed by Dr. Windle Guard.    ALLERGIES:  CODEINE, which causes nausea.   PAST SURGICAL HISTORY:  Include hernia surgery, lumbar surgery by Dr. Ophelia Charter in 2005, gallbladder and tonsillectomy.   SOCIAL HISTORY:   He is married to his with Olegario Messier.  She is with him today.  He did smoke for a 32-pack year history, but quit.  Occasionally drinks.   FAMILY HISTORY:  Positive for breast cancer, hypertension, and diabetes.   REVIEW OF SYSTEMS:  A 14-point review of systems is positive for depression, anxiety, acid reflux, previous L4-5 right HNP, fibromyalgia, PTSD, and hypertension.      Past Medical History  Diagnosis Date  . Hypertension   . Diabetes mellitus without complication     pt states he is diet controlled and no longer requires meds   . Anxiety     PTSD  . Hepatitis     as a child  . Headache   . Fibromyalgia     Past Surgical History  Procedure Laterality Date  . Cholecystectomy    . Back surgery    . Surgery to remove glass from neck    . Hernia repair    . Tonsillectomy      No family history on file. Social History:  reports that he quit smoking about 2 years ago. He does not have any smokeless tobacco history on file. He reports that he drinks alcohol. He reports that he does  not use illicit drugs.  Allergies:  Allergies  Allergen Reactions  . Codeine Other (See Comments)    Makes bowel movements very difficult    No prescriptions prior to admission    No results found for this or any previous visit (from the past 48 hour(s)). No results found.  Review of Systems  Constitutional: Negative.   HENT: Negative.   Respiratory: Negative.   Cardiovascular: Negative.   Gastrointestinal: Negative.   Genitourinary: Negative.   Musculoskeletal: Positive for back pain.  Psychiatric/Behavioral: Negative.     There were no vitals taken for this visit. Physical Exam  Constitutional: He is oriented to person, place, and time. He appears well-developed.  HENT:  Head: Normocephalic.  Eyes: Pupils are equal, round, and reactive to light.  Neck: Normal range of motion.  Respiratory: No respiratory distress.  GI: He exhibits no distension.  Musculoskeletal: He exhibits tenderness.  Neurological: He is alert and oriented to person, place, and time.  Skin: Skin is warm and dry.    PHYSICAL EXAMINATION:  The patient is alert and oriented.  WD, WN.  He is in acute distress.  Extraocular movements are  intact.  Normal smiling grimace.  He has had 2 pain pills today.  Cervical range of motion is full.  No audible wheezing.  Pulse is regular.  No abdominal tenderness.  Positive straight leg raise.  Positive popliteal compression test.  He has definite weakness of EHL on the right.  Anterior tib is trace weak.  Left EHL and anterior tib are strong.  Gastrocsoleus is normal on both the right and left.  No quad weakness.   RADIOGRAPHS:  MRI scan is reviewed, which shows a large recurrent HNP with significant compression, with moderate to large subarticular disc extrusion with significant nerve root compression and lateral recess severe stenosis.  No significant central stenosis.     ASSESSMENT:  Large recurrent HNP with radiculopathy.  He states he is not able to handle the  pain.  Disc herniation is larger than it was back in 2005.   PLAN:  We discussed options.  Since he did great in the interval up until his onset of pain in May, I would recommend a simple repeat microdiscectomy.  Risks of surgery discussed including dural tear, spinal fluid leakage, reoperation, risks of infection, risks of anesthesia, and postoperative activity modification for 6 weeks.  All questions answered.  He understands there is some risk of recurrent disc problems in the future, possibly the need for a fusion.  He understands and requests to proceed.  Sequoyah Counterman M 09/25/2014, 7:16 AM

## 2014-09-25 NOTE — Progress Notes (Signed)
Pt and family requested a bigger room because they cannot stay in such a small room.  They stated they were told they would be in a large room in the [facility] tower with a bed for the wife to stay overnight. Spoke with Pt and family and explained why he came to this unit but they still waited to be moved. Pt transferred to 5N03. Rema Fendt, RN

## 2014-09-25 NOTE — Anesthesia Preprocedure Evaluation (Addendum)
Anesthesia Evaluation  Patient identified by MRN, date of birth, ID band Patient awake    Reviewed: Allergy & Precautions, NPO status , Patient's Chart, lab work & pertinent test results  Airway Mallampati: II  TM Distance: >3 FB Neck ROM: Full    Dental  (+) Edentulous Upper, Edentulous Lower   Pulmonary former smoker,  breath sounds clear to auscultation        Cardiovascular Exercise Tolerance: Good METS: 3 - Mets hypertension, Pt. on medications and Pt. on home beta blockers Rhythm:Regular Rate:Normal     Neuro/Psych    GI/Hepatic negative GI ROS, GERD- (food and volume  dependent )  ,(+) Hepatitis -  Endo/Other  diabetes  Renal/GU      Musculoskeletal   Abdominal   Peds  Hematology   Anesthesia Other Findings   Reproductive/Obstetrics                        Anesthesia Physical Anesthesia Plan  ASA: III  Anesthesia Plan: General   Post-op Pain Management:    Induction: Intravenous  Airway Management Planned: Oral ETT  Additional Equipment:   Intra-op Plan:   Post-operative Plan: Extubation in OR  Informed Consent: I have reviewed the patients History and Physical, chart, labs and discussed the procedure including the risks, benefits and alternatives for the proposed anesthesia with the patient or authorized representative who has indicated his/her understanding and acceptance.   Dental advisory given  Plan Discussed with: CRNA and Anesthesiologist  Anesthesia Plan Comments:         Anesthesia Quick Evaluation

## 2014-09-25 NOTE — Anesthesia Procedure Notes (Signed)
Procedure Name: Intubation Performed by: Renae Fickle Pre-anesthesia Checklist: Patient identified, Emergency Drugs available, Suction available and Patient being monitored Patient Re-evaluated:Patient Re-evaluated prior to inductionOxygen Delivery Method: Circle system utilized Preoxygenation: Pre-oxygenation with 100% oxygen Intubation Type: IV induction Ventilation: Mask ventilation without difficulty Grade View: Grade I Tube type: Oral Number of attempts: 1 Placement Confirmation: ETT inserted through vocal cords under direct vision,  positive ETCO2 and breath sounds checked- equal and bilateral Secured at: 22 cm Tube secured with: Tape Dental Injury: Teeth and Oropharynx as per pre-operative assessment

## 2014-09-25 NOTE — Progress Notes (Signed)
Report called to 5N. Pt transferred to 5N03. Pt is stable upon transfer. Rema Fendt, RN

## 2014-09-25 NOTE — Interval H&P Note (Signed)
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 After consideration of risks, benefits and other options for treatment, the patient has consented to  Procedure(s): Right L4-5 Microdiscectomy for Recurrent Herniated Nucleus Pulposus (Right) as a surgical intervention .  The patient's history has been reviewed, patient examined, no change in status, stable for surgery.  I have reviewed the patient's chart and labs.  Questions were answered to the patient's satisfaction.     YATES,MARK C

## 2014-09-25 NOTE — Transfer of Care (Signed)
Immediate Anesthesia Transfer of Care Note  Patient: Frank Green  Procedure(s) Performed: Procedure(s): Right L4-5 Microdiscectomy for Recurrent Herniated Nucleus Pulposus (Right)  Patient Location: PACU  Anesthesia Type:General  Level of Consciousness: awake, alert , oriented and patient cooperative  Airway & Oxygen Therapy: Patient Spontanous Breathing and Patient connected to nasal cannula oxygen  Post-op Assessment: Report given to RN, Post -op Vital signs reviewed and stable and Patient moving all extremities X 4  Post vital signs: Reviewed and stable  Last Vitals:  Filed Vitals:   09/25/14 0829  BP: 164/89  Pulse: 65  Temp: 36.5 C  Resp: 20    Complications: No apparent anesthesia complications

## 2014-09-25 NOTE — Brief Op Note (Signed)
09/25/2014  1:11 PM  PATIENT:  Gilmore Laroche  49 y.o. 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 Barry Dienes ANESTHESIA:   general  EBL:  Total I/O In: 1100 [I.V.:1100] Out: 100 [Blood:100]  BLOOD ADMINISTERED:none  DRAINS: none   LOCAL MEDICATIONS USED:  MARCAINE     SPECIMEN:  No Specimen  DISPOSITION OF SPECIMEN:  N/A  COUNTS:  YES  TOURNIQUET:  * No tourniquets in log *    PATIENT DISPOSITION:  PACU - hemodynamically stable.

## 2014-09-25 NOTE — Anesthesia Postprocedure Evaluation (Signed)
  Anesthesia Post-op Note  Patient: Frank Green  Procedure(s) Performed: Procedure(s): Right L4-5 Microdiscectomy for Recurrent Herniated Nucleus Pulposus (Right)  Patient Location: PACU  Anesthesia Type:General  Level of Consciousness: awake  Airway and Oxygen Therapy: Patient Spontanous Breathing  Post-op Pain: mild  Post-op Assessment: Post-op Vital signs reviewed              Post-op Vital Signs: Reviewed  Last Vitals:  Filed Vitals:   09/25/14 0829  BP: 164/89  Pulse: 65  Temp: 36.5 C  Resp: 20    Complications: No apparent anesthesia complications

## 2014-09-26 DIAGNOSIS — M5116 Intervertebral disc disorders with radiculopathy, lumbar region: Secondary | ICD-10-CM | POA: Diagnosis not present

## 2014-09-26 NOTE — Op Note (Signed)
NAMEMARCY, Green NO.:  [id number]  MEDICAL RECORD NO.:  [id number]  LOCATION:  5N03C                        FACILITY:  [facility]  PHYSICIAN:  Nghia Mcentee C. Ophelia Green, M.D.    DATE OF BIRTH:  1965/08/14  DATE OF PROCEDURE:  09/25/2014 DATE OF DISCHARGE:                              OPERATIVE REPORT   PREOPERATIVE DIAGNOSIS:  Recurrent L4-5 herniated nucleus pulposus with radiculopathy.  POSTOPERATIVE DIAGNOSIS:  Recurrent L4-5 herniated nucleus pulposus with radiculopathy.  PROCEDURE:  Microdiskectomy for recurrent HNP, right L4-5.  SURGEON:  Shimon Trowbridge C. Ophelia Green, M.D.  ASSISTANT:  Genene Churn. Barry Dienes, PA-C, medically necessary and present for the entire procedure.  ESTIMATED BLOOD LOSS:  Minimal.  FINDINGS:  Recurrent HNP, large, adherent to the dura.  DESCRIPTION OF PROCEDURE:  After induction of general anesthesia, orotracheal intubation, the patient was placed in the prone position, careful padding, positioning pads over the ulnar nerve, rolled sponge underneath the shoulder.  Back was prepped with DuraPrep.  Area was squared with towels.  Old incision was out lined with the skin marker, cross hatches made and Betadine Steri-Drape applied.  Time-out procedure was completed.  Ancef was given prophylactically.  Old incision was opened and extended 3 to 4 mm cephalad and caudad.  Subperiosteal dissection on the lamina and then a Penfield 4 was placed over the old laminotomy area on the right at L4 lamina for the expected level which was confirmed with cross-table lateral x-ray.  Using a bur, the lamina was thinned.  Lateral gutter was opened up with 2 and 3-mm Kerrison. Operative microscope had been draped and brought in.  Extension cephalad until epidural fat was visualized and then falling long on top of the dura, the ligamentum was separated and then removed.  Some spurs were removed out to the level of the pedicle.  Foramina was enlarged.  Nerve root was free.  Dura  did not want to move toward the midline.  Nerve root was adherent, anteriorly stuck down.  Annulus was incised with protection of the nerve root with a D'Errico.  Passes were made with micropituitary.  Epsteins were gradually used to peel the under surface of the nerve root back.  Finally, portion of the large fragment was caught as was teased out with a ball-tipped probe, and then was peeled off the anterior surface of the dura.  Once the ventral surface of the dura was decompressed removing large chunks of disk, some remaining disk pieces were removed.  Epsteins were used, passes progressing to the midline, nerve root was much more free.  A hockey stick could be passed out ventral and dorsal surface of the nerve root.  No spurs were present along the medial aspect of the pedicle.  Hockey stick could be passed anterior to the dura after decompression removing all pieces of disk. The patient had scar tissue from the previous disk herniation and then had blown at larger fragment which was adherent and stuck to the dura. Once this was decompressed, nerve root was free.  Palpation with the hockey stick on top of the dura showed no areas of compression.  Hockey stick could easily be placed laterally in the gutter and then  spun from cephalad to caudad and through the dura with no areas of compression. Epidural space was dry.  Some veins were coagulated with the bipolar cautery.  Copious irrigation with saline solution, then closure with #1 Vicryl, 2-0 Vicryl subcutaneous tissue, subcuticular skin closure.  Dermabond application sealing the skin and then postop Mepilex dressing.  The patient tolerated the procedure well and was transferred to the recovery room in stable condition.     Frank Green, M.D.     MCY/MEDQ  D:  09/25/2014  T:  09/26/2014  Job:  [id number]

## 2014-09-26 NOTE — Progress Notes (Signed)
Subjective: 1 Day Post-Op Procedure(s) (LRB): Right L4-5 Microdiscectomy for Recurrent Herniated Nucleus Pulposus (Right) Patient reports pain as mild.  Incisional pain only. Leg pain gone.   Objective: Vital signs in last 24 hours: Temp:  [97.6 F (36.4 C)-98.8 F (37.1 C)] 97.7 F (36.5 C) (08/06 0446) Pulse Rate:  [76-86] 84 (08/06 0446) Resp:  [8-18] 18 (08/06 0446) BP: (129-154)/(69-92) 130/70 mmHg (08/06 0446) SpO2:  [94 %-98 %] 97 % (08/06 0446)  Intake/Output from previous day: 08/05 0701 - 08/06 0700 In: 1340 [P.O.:240; I.V.:1100] Out: 100 [Blood:100] Intake/Output this shift:    No results for input(s): HGB in the last 72 hours. No results for input(s): WBC, RBC, HCT, PLT in the last 72 hours. No results for input(s): NA, K, CL, CO2, BUN, CREATININE, GLUCOSE, CALCIUM in the last 72 hours. No results for input(s): LABPT, INR in the last 72 hours.  Neurologically intact  Assessment/Plan: 1 Day Post-Op  Plan : discharge home. Office one week . Activity discussed. OK to shower at home.   Emmelyn Schmale C 09/26/2014, 10:32 AM

## 2014-09-26 NOTE — Discharge Instructions (Signed)
Walk daily avoid sitting. Take pain medication as needed.  OK to shower then recover incision so waist of pants do not rub against the incision. Avoid sitting . See Dr. Ophelia Charter in 1 week .

## 2014-09-28 ENCOUNTER — Encounter (HOSPITAL_COMMUNITY): Payer: Self-pay | Admitting: Orthopaedic Surgery

## 2014-10-07 NOTE — Discharge Summary (Signed)
Patient ID: Frank Green MRN: [id number] DOB/AGE: 06-20-1965 49 y.o.  Admit date: 09/25/2014 Discharge date: 10/07/2014  Admission Diagnoses:  Active Problems:   S/P lumbar discectomy   Discharge Diagnoses:  Active Problems:   S/P lumbar discectomy  status post Procedure(s): Right L4-5 Microdiscectomy for Recurrent Herniated Nucleus Pulposus  Past Medical History  Diagnosis Date  . Hypertension   . Diabetes mellitus without complication     pt states he is diet controlled and no longer requires meds   . Anxiety     PTSD  . Hepatitis     as a child  . Headache   . Fibromyalgia     Surgeries: Procedure(s): Right L4-5 Microdiscectomy for Recurrent Herniated Nucleus Pulposus on 09/25/2014   Consultants:    Discharged Condition: Improved  Hospital Course: Frank Green is an 49 y.o. male who was admitted 09/25/2014 for operative treatment of lumbar hnp. Patient failed conservative treatments (please see the history and physical for the specifics) and had severe unremitting pain that affects sleep, daily activities and work/hobbies. After pre-op clearance, the patient was taken to the operating room on 09/25/2014 and underwent  Procedure(s): Right L4-5 Microdiscectomy for Recurrent Herniated Nucleus Pulposus.    Patient was given perioperative antibiotics:  Anti-infectives    Start     Dose/Rate Route Frequency Ordered Stop   09/25/14 0853  ceFAZolin (ANCEF) 2-3 GM-% IVPB SOLR    Comments:  Ray Church   : cabinet override      09/25/14 4098 09/25/14 1115       Patient was given sequential compression devices and early ambulation to prevent DVT.   Patient benefited maximally from hospital stay and there were no complications. At the time of discharge, the patient was urinating/moving their bowels without difficulty, tolerating a regular diet, pain is controlled with oral pain medications and they have been cleared by PT/OT.   Recent vital signs: No data found.     Recent laboratory studies: No results for input(s): WBC, HGB, HCT, PLT, NA, K, CL, CO2, BUN, CREATININE, GLUCOSE, INR, CALCIUM in the last 72 hours.  Invalid input(s): PT, 2   Discharge Medications:     Medication List    STOP taking these medications        HYDROcodone-acetaminophen 5-325 MG per tablet  Commonly known as:  NORCO/VICODIN     oxyCODONE-acetaminophen 5-325 MG per tablet  Commonly known as:  PERCOCET/ROXICET  Replaced by:  oxyCODONE-acetaminophen 7.5-325 MG per tablet      TAKE these medications        atenolol 100 MG tablet  Commonly known as:  TENORMIN  Take 100 mg by mouth daily.     methocarbamol 500 MG tablet  Commonly known as:  ROBAXIN  Take 1 tablet (500 mg total) by mouth every 6 (six) hours as needed for muscle spasms.     oxyCODONE-acetaminophen 7.5-325 MG per tablet  Commonly known as:  PERCOCET  Take 1 tablet by mouth every 6 (six) hours as needed for severe pain.        Diagnostic Studies: Dg Chest 2 View  09/16/2014   CLINICAL DATA:  Preop for HNP scheduled for 10 days. No recent chest complaints. History of hypertension, smoking.  EXAM: CHEST  2 VIEW  COMPARISON:  12/15/2013 and 03/01/2004  FINDINGS: Heart size is normal. There is elevation of the right hemidiaphragm, stable over prior studies. There are no focal consolidations. No pleural effusions. No pulmonary edema. Old right rib fractures  are noted. Surgical clips are present in the right upper quadrant of the abdomen.  IMPRESSION: No evidence for acute cardiopulmonary abnormality.   Electronically Signed   By: Norva Pavlov M.D.   On: 09/16/2014 13:19   Dg Lumbar Spine 1 View  09/25/2014   CLINICAL DATA:  L4-L5 RIGHT microdiskectomy, recurrent HNP  EXAM: LUMBAR SPINE - 1 VIEW  COMPARISON:  Portable cross-table intraoperative exam 1118 hours compared to MRI lumbar spine 08/27/2014  FINDINGS: Prior MRI labeled with 5 lumbar vertebra, current exam labeled similarly.  Metallic probe via  posterior approach projects dorsal to the L4-L5 disc space.  Tissue retractors are present dorsal to the L4 and L5 segments.  Vertebral body heights maintained without subluxation.  IMPRESSION: Intraoperative dorsal localization of the L4-L5 disc space level.   Electronically Signed   By: Ulyses Southward M.D.   On: 09/25/2014 12:26        Discharge Instructions    Call MD / Call 911    Complete by:  As directed   If you experience chest pain or shortness of breath, CALL 911 and be transported to the hospital emergency room.  If you develope a fever above 101 F, pus (white drainage) or increased drainage or redness at the wound, or calf pain, call your surgeon's office.     Constipation Prevention    Complete by:  As directed   Drink plenty of fluids.  Prune juice may be helpful.  You may use a stool softener, such as Colace (over the counter) 100 mg twice a day.  Use MiraLax (over the counter) for constipation as needed.     Diet - low sodium heart healthy    Complete by:  As directed      Discharge instructions    Complete by:  As directed   Ok to shower 5 days postop.  Do not apply any creams or ointments to incision.  Do not remove steri-strips.  Can use 4x4 gauze and tape for dressing changes.  No aggressive activity.  No bending, squatting or prolonged sitting.  Mostly be in reclined position or lying down.     Driving restrictions    Complete by:  As directed   No driving     Increase activity slowly as tolerated    Complete by:  As directed      Lifting restrictions    Complete by:  As directed   No lifting           Follow-up Information    Schedule an appointment as soon as possible for a visit with Eldred Manges, MD.   Specialty:  Orthopedic Surgery   Why:  need return office visit one week postop   Contact information:   [address] [place] [address] [phone]       Discharge Plan:  discharge to home  Disposition:     Signed: Naida Sleight   10/07/2014, 4:22 PM

## 2016-03-19 IMAGING — CR DG CHEST 2V
2 series · 2 of 2 positions shown · non-contrast
Comparison: 12/15/2013 and 03/01/2004

CLINICAL DATA: Preop for HNP scheduled for 10 days. No recent chest
complaints. History of hypertension, smoking.

EXAM:
CHEST  2 VIEW

[w chest pa]
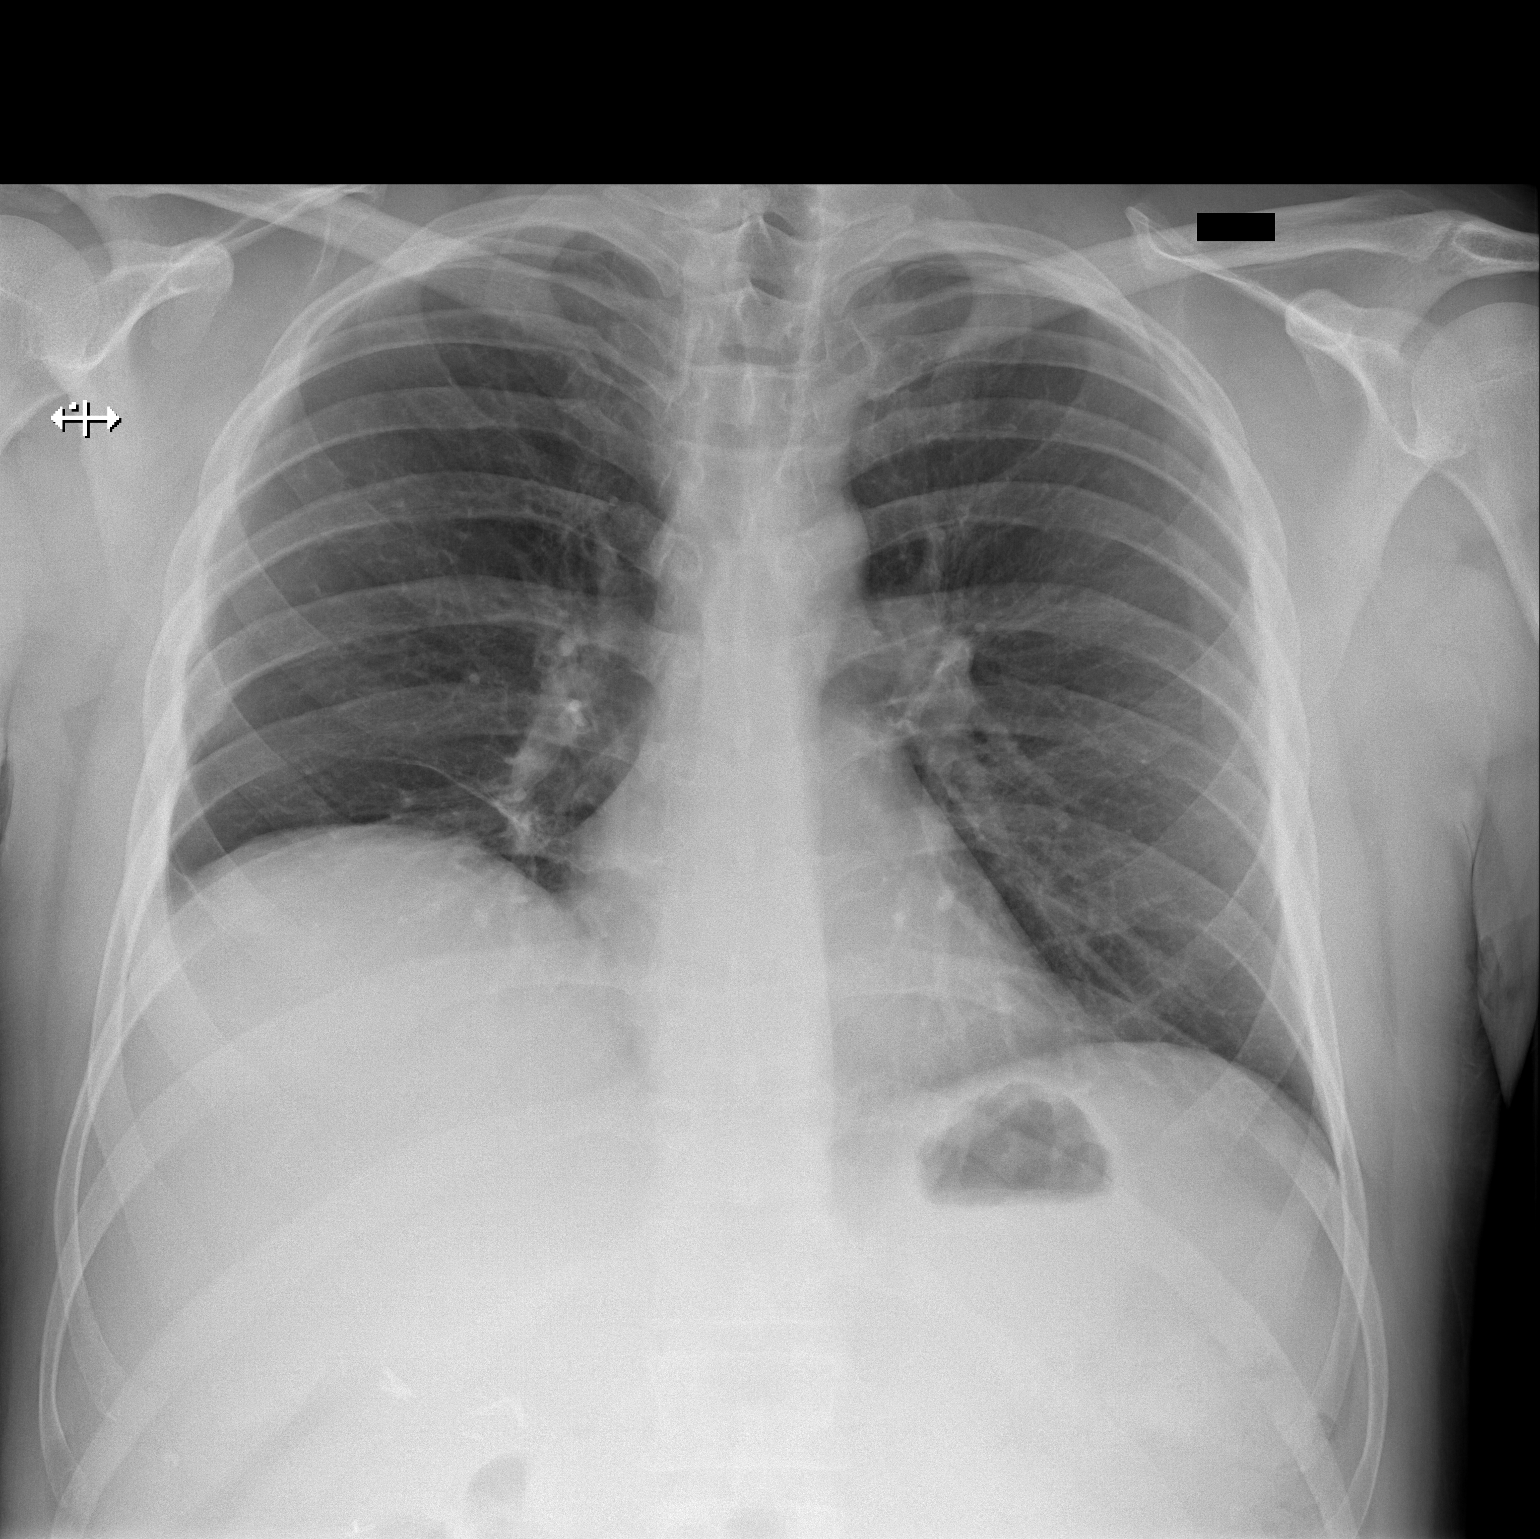

[w chest lat]
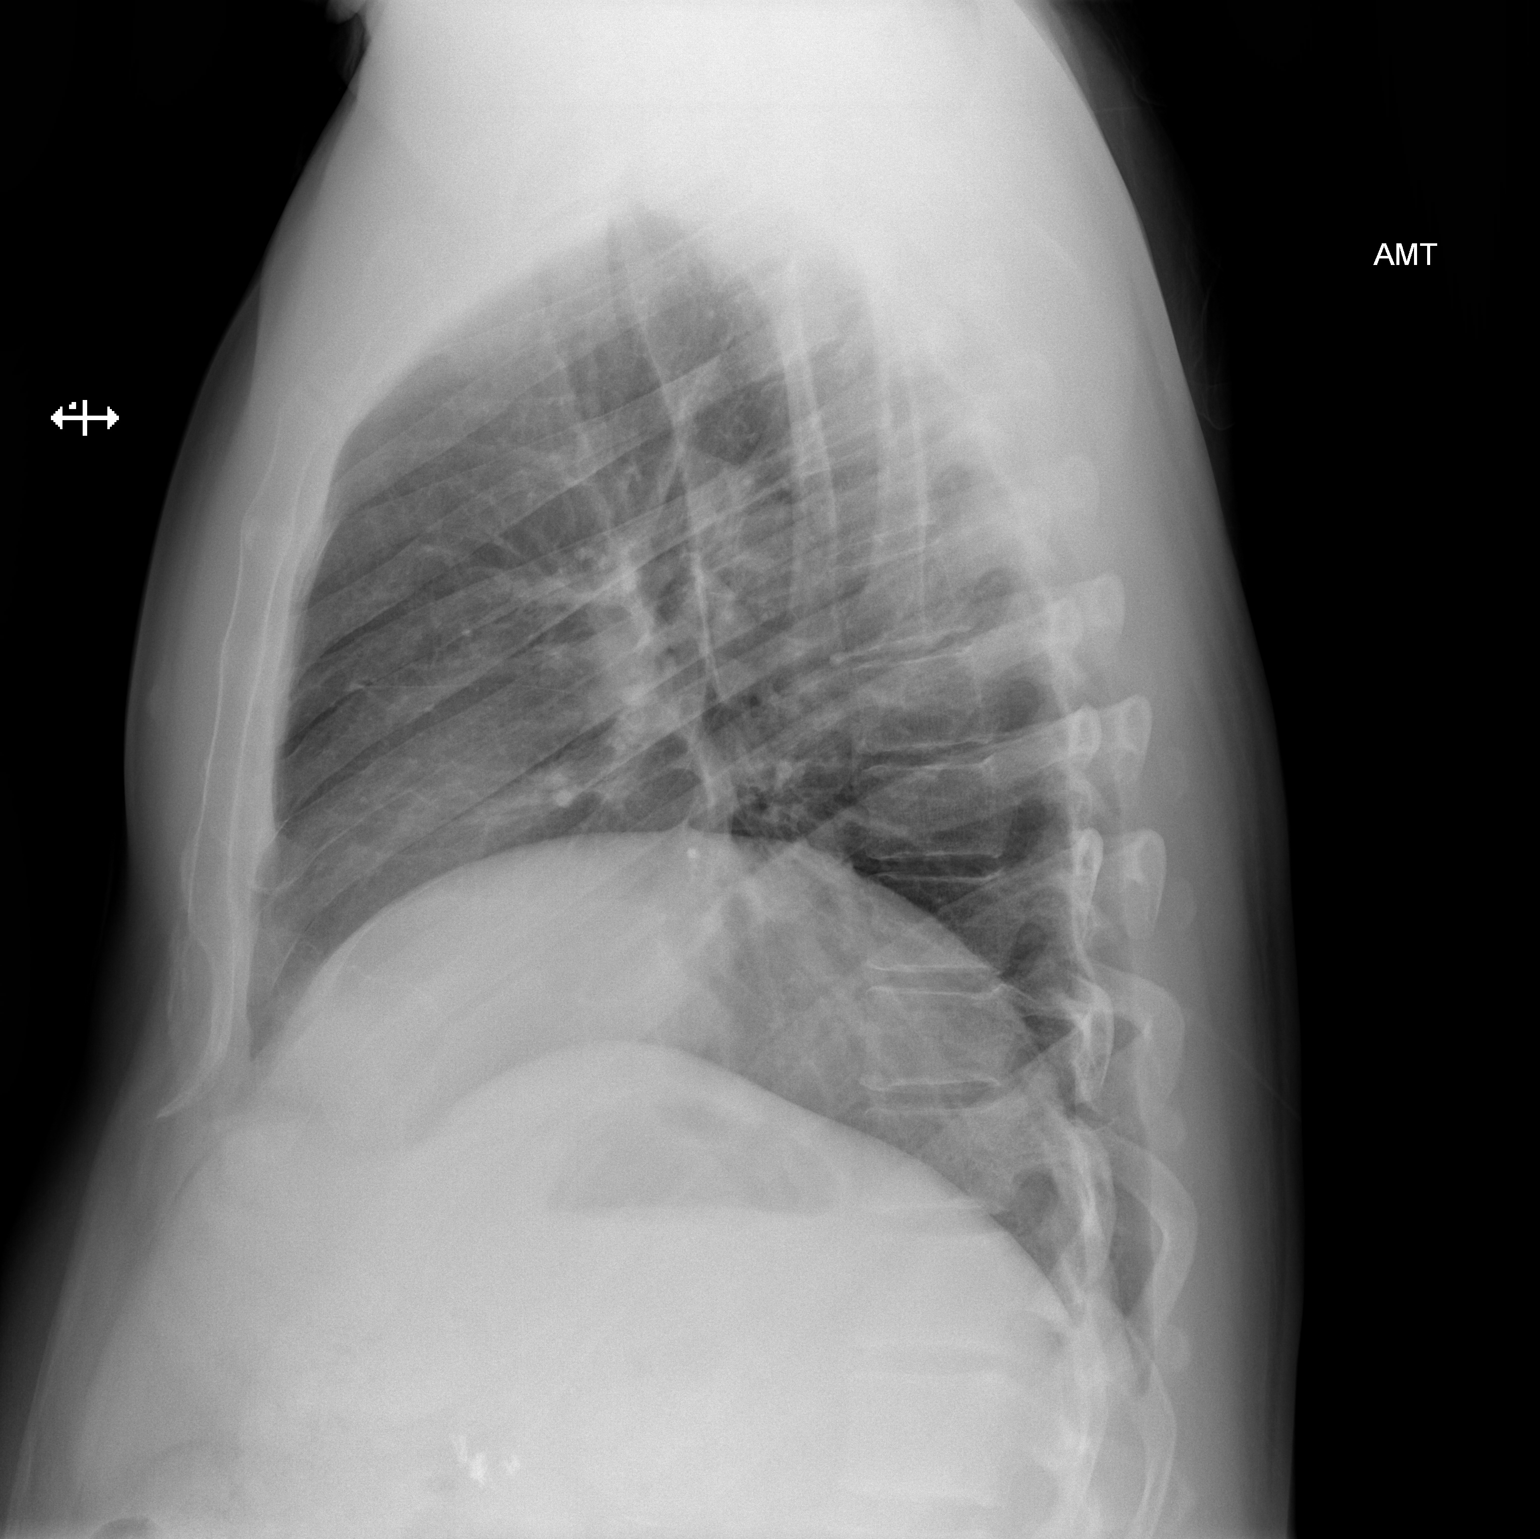

[2 of 2 positions shown; findings below may reference images not displayed]

FINDINGS: Heart size is normal. There is elevation of the right hemidiaphragm,
stable over prior studies. There are no focal consolidations. No
pleural effusions. No pulmonary edema. Old right rib fractures are
noted. Surgical clips are present in the right upper quadrant of the
abdomen.
IMPRESSION: No evidence for acute cardiopulmonary abnormality.

## 2017-06-05 ENCOUNTER — Telehealth (INDEPENDENT_AMBULATORY_CARE_PROVIDER_SITE_OTHER): Payer: Self-pay | Admitting: Orthopaedic Surgery

## 2017-06-05 NOTE — Telephone Encounter (Signed)
Patient's wife returned call. They are unable to make appt this afternoon. She states her husband will just wait it out and see how it goes.

## 2017-06-05 NOTE — Telephone Encounter (Signed)
Called patient's wife. Was going to try and work patient in for 3:30p appt today. She could not hear me and was going to call back.

## 2017-06-05 NOTE — Telephone Encounter (Signed)
Right inner knee pain associated with a limp for about a week now.Pt wanted to be seen today if possible

## 2017-08-01 ENCOUNTER — Telehealth (INDEPENDENT_AMBULATORY_CARE_PROVIDER_SITE_OTHER): Payer: Self-pay | Admitting: Orthopaedic Surgery

## 2017-08-01 NOTE — Telephone Encounter (Signed)
Patient is having pretty intense knee pain, made him an appointment for Tuesday but would like to be called if something opens up before then. CB # [phone]

## 2017-08-06 NOTE — Telephone Encounter (Signed)
No earlier appt available. Will see patient in clinic tomorrow.

## 2017-08-07 ENCOUNTER — Ambulatory Visit (INDEPENDENT_AMBULATORY_CARE_PROVIDER_SITE_OTHER): Payer: Self-pay | Admitting: Orthopaedic Surgery

## 2017-08-21 ENCOUNTER — Ambulatory Visit (INDEPENDENT_AMBULATORY_CARE_PROVIDER_SITE_OTHER): Payer: BC Managed Care – PPO | Admitting: Orthopaedic Surgery

## 2017-08-21 ENCOUNTER — Ambulatory Visit (INDEPENDENT_AMBULATORY_CARE_PROVIDER_SITE_OTHER): Payer: Self-pay

## 2017-08-21 ENCOUNTER — Encounter (INDEPENDENT_AMBULATORY_CARE_PROVIDER_SITE_OTHER): Payer: Self-pay | Admitting: Orthopaedic Surgery

## 2017-08-21 VITALS — BP 154/94 | HR 70 | Ht 70.0 in | Wt 229.0 lb

## 2017-08-21 DIAGNOSIS — G8929 Other chronic pain: Secondary | ICD-10-CM

## 2017-08-21 DIAGNOSIS — M25561 Pain in right knee: Secondary | ICD-10-CM

## 2017-08-21 MED ORDER — TRAMADOL HCL 50 MG PO TABS: 50.0000 mg | ORAL_TABLET | Freq: Three times a day (TID) | ORAL | 0 refills | Status: DC | PRN

## 2017-08-21 NOTE — Progress Notes (Signed)
Office Visit Note   Patient: Frank Green           Date of Birth: 06/11/1965           MRN: [id number] Visit Date: 08/21/2017              Requested by: Kaleen MaskElkins, Wilson Oliver, MD [address] [place], [place] [id number] PCP: Kaleen MaskElkins, Wilson Oliver, MD   Assessment & Plan: Visit Diagnoses:  1. Acute pain of right knee   2. Chronic pain of right knee   3. Mechanical knee pain, right     Plan: With patient's ongoing and progressively worsening right knee pain and mechanical symptoms I recommend that he get an MRI to rule out medial meniscus tear.  I did give him some straight leg raise quad strengthening exercises to do.  He can work on find out knee range of motion but he is already having a problem with trying to reach full extension on that knee due to mechanical symptoms and pain.  Follow-up with Dr. Ophelia CharterYates after completion of his scan to discuss results and further treatment options.  Ultimately it will likely come down him needing outpatient arthroscopy if he indeed does have a meniscus tear.  Follow-Up Instructions: Return in about 2 weeks (around 09/04/2017) for With Dr. Ophelia CharterYates to review right knee MRI.   Orders:  Orders Placed This Encounter  Procedures  . XR Knee 1-2 Views Right  . MR Knee Right w/o contrast   Meds ordered this encounter  Medications  . traMADol (ULTRAM) 50 MG tablet    Sig: Take 1 tablet (50 mg total) by mouth every 8 (eight) hours as needed.    Dispense:  40 tablet    Refill:  0      Procedures: No procedures performed   Clinical Data: No additional findings.   Subjective: Chief Complaint  Patient presents with  . Right Knee - Pain    HPI 52 year old white male comes into the office today with complaints of right knee pain mechanical symptoms.  Patient states about 3 months ago he was walking around the campus of his daughter's college  when he twisted and felt a sharp pain medial knee.  Since that time is been having ongoing  medial knee pain, swelling and feeling of the knee locking and catching.  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  Has been trying to manage this conservatively with oral NSAIDs and ice.  Has been trying to do some exercises for the knee but this is becoming increasingly more painful and difficult.  Pain aggravated with ambulation, squatting, pivoting. Review of Systems No current cardiac pulmonary GI GU issues  Objective: Vital Signs: BP (!) 154/94   Pulse 70   Ht 5\' 10"  (1.778 m)   Wt 229 lb (103.9 kg)   BMI 32.86 kg/m   Physical Exam  Constitutional: He appears well-developed and well-nourished. No distress.  HENT:  Head: Normocephalic and atraumatic.  Eyes: Pupils are equal, round, and reactive to light. EOM are normal.  Neck: Normal range of motion.  Pulmonary/Chest: No respiratory distress.  Abdominal: He exhibits no distension.  Musculoskeletal:  Some knee swelling with positive effusion.  Exquisitely tender at the medial joint line.  Positive McMurray's test.  Cruciate collateral ligaments are stable.  He lacks few degrees full extension due to some feeling  of the knee locking.  Negative logroll bilateral hips.  Calves nontender.  Neurovascularly intact.   Neurological: He is alert.  Skin: Skin is warm and dry.  Psychiatric: He has a normal mood and affect.    Ortho Exam  Specialty Comments:  No specialty comments available.  Imaging: Xr Knee 1-2 Views Right  Result Date: 08/21/2017 X-ray right knee shows some tricompartmental narrowing but otherwise looks pretty good.  No acute finding.    PMFS History: Patient Active Problem List   Diagnosis Date Noted  . S/P lumbar discectomy 09/25/2014   Past Medical History:  Diagnosis Date  . Anxiety    PTSD  . Diabetes mellitus without complication (HCC)    pt states he is diet controlled and no longer  requires meds   . Fibromyalgia   . Headache   . Hepatitis    as a child  . Hypertension     History reviewed. No pertinent family history.  Past Surgical History:  Procedure Laterality Date  . BACK SURGERY    . CHOLECYSTECTOMY    . HERNIA REPAIR    . LUMBAR LAMINECTOMY/DECOMPRESSION MICRODISCECTOMY Right 09/25/2014   Procedure: Right L4-5 Microdiscectomy for Recurrent Herniated Nucleus Pulposus;  Surgeon: Eldred Manges, MD;  Location: [facility] OR;  Service: Orthopedics;  Laterality: Right;  . surgery to remove glass from neck    . TONSILLECTOMY     Social History   Occupational History  . Not on file  Tobacco Use  . Smoking status: Former Smoker    Last attempt to quit: 12/17/2011    Years since quitting: 5.6  Substance and Sexual Activity  . Alcohol use: Yes    Comment: occ  . Drug use: No  . Sexual activity: Not on file

## 2017-08-25 ENCOUNTER — Ambulatory Visit
Admission: RE | Admit: 2017-08-25 | Discharge: 2017-08-25 | Disposition: A | Discharge status: Home / Self Care | Payer: BC Managed Care – PPO | Source: Ambulatory Visit | Attending: Surgery | Admitting: Surgery

## 2017-08-25 DIAGNOSIS — M25561 Pain in right knee: Principal | ICD-10-CM

## 2017-08-25 DIAGNOSIS — G8929 Other chronic pain: Secondary | ICD-10-CM

## 2017-08-28 ENCOUNTER — Ambulatory Visit (INDEPENDENT_AMBULATORY_CARE_PROVIDER_SITE_OTHER): Payer: BC Managed Care – PPO | Admitting: Orthopaedic Surgery

## 2017-08-28 ENCOUNTER — Encounter (INDEPENDENT_AMBULATORY_CARE_PROVIDER_SITE_OTHER): Payer: Self-pay | Admitting: Orthopaedic Surgery

## 2017-08-28 ENCOUNTER — Other Ambulatory Visit: Payer: Self-pay

## 2017-08-28 ENCOUNTER — Encounter (HOSPITAL_BASED_OUTPATIENT_CLINIC_OR_DEPARTMENT_OTHER): Payer: Self-pay | Admitting: *Deleted

## 2017-08-28 VITALS — BP 150/93 | HR 61 | Ht 70.0 in | Wt 230.0 lb

## 2017-08-28 DIAGNOSIS — S83241D Other tear of medial meniscus, current injury, right knee, subsequent encounter: Secondary | ICD-10-CM

## 2017-08-28 NOTE — Progress Notes (Signed)
Office Visit Note   Patient: Frank LarocheBruce W Wunschel           Date of Birth: Jul 03, 1965           MRN: [id number] Visit Date: 08/28/2017              Requested by: Kaleen MaskElkins, Wilson Oliver, MD [address] [place], [place] [id number] PCP: Kaleen MaskElkins, Wilson Oliver, MD   Assessment & Plan: Visit Diagnoses:  1. 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  He also has a daughter that he is can have to move and to college mid August.  He continues to have catching locking symptoms with complex posterior medial meniscal tear.  He does have some mild medial compartment degenerative changes and minimal leaking noted on MRI scan but is having symptoms from meniscal fragment which is displaced with mechanical catching.  We discussed length of time out of work usually in the 2 to 3-week range for climbing in and out of bands or bus.  Questions were elicited and answered he understands request to proceed.  Follow-Up Instructions: No follow-ups on file.   Orders:  No orders of the defined types were placed in this encounter.  No orders of the defined types were placed in this encounter.     Procedures: No procedures performed   Clinical Data: No additional findings.   Subjective: Chief Complaint  Patient presents with  . Right Knee - Pain, Follow-up    MRI review    HPI 52 year old male returns with ongoing pain with ambulation in his right knee.  It bothers him at night when he rolls over he has to sleep with a pillow between his knees.  He is ambulating with a cane and states at times his knee is catching and locking with pain medial joint line right knee posteriorly.  Denies left knee problems.  No chills or fever.  He has been on activity modification, anti-inflammatories using ice heat topical creams and also  tramadol without relief.  Tramadol bothered his stomach and is now stopped it.  At times with his knee flexed it locks is had difficulty getting it extended after juggling his knee some but does break loose and is able to get the knee back in extension.  He is using a cane with ambulation and has not driven since 08/21/2017.  Review of Systems previous recurrent disc surgery 2016 doing well at L4-5 on the right.  He does have hypertension takes atenolol.  Cardiovascular respiratory GI GU negative.  14 point review of systems updated and negative as pertains HPI.   Objective: Vital Signs: BP (!) 150/93   Pulse 61   Ht 5\' 10"  (1.778 m)   Wt 230 lb (104.3 kg)   BMI 33.00 kg/m   Physical Exam  Constitutional: He is oriented to person, place, and time. He appears well-developed and well-nourished.  HENT:  Head: Normocephalic and atraumatic.  Eyes: Pupils are equal, round, and reactive to light. EOM are normal.  Neck: No tracheal deviation present. No thyromegaly present.  Cardiovascular: Normal rate.  Pulmonary/Chest: Effort normal. He has no wheezes.  Abdominal: Soft. Bowel sounds are normal.  Neurological: He is alert and oriented to person, place, and time.  Skin: Skin is warm and dry. Capillary refill takes less than 2  seconds.  Psychiatric: He has a normal mood and affect. His behavior is normal. Judgment and thought content normal.    Ortho Exam well-healed lumbar incision at L4-5, anterior tib gastrocsoleus is intact.  He has severe posterior medial joint line tenderness the right knee no lateral joint line tenderness 2+ knee effusion.  With flexion he has severe pain after flexion 110 degrees points to the posterior medial joint line.  Distal pulses are 2+ sensation in the foot is intact.  Negative logroll to the hips.  Specialty Comments:  No specialty comments available.  Imaging: CLINICAL DATA:  Right knee pain since a twisting injury approximately 3 months ago.  EXAM: MRI  OF THE RIGHT KNEE WITHOUT CONTRAST  TECHNIQUE: Multiplanar, multisequence MR imaging of the knee was performed. No intravenous contrast was administered.  COMPARISON:  Plain films right knee 08/25/2017  FINDINGS: MENISCI  Medial meniscus: Complex tear is seen in the posterior horn. No displaced fragment.  Lateral meniscus:  Intact.  LIGAMENTS  Cruciates:  Intact.  Collaterals:  Intact.  CARTILAGE  Patellofemoral:  Minimally degenerated.  Medial:  Minimally degenerated.  Lateral:  Preserved.  Joint:  Small joint effusion.  Medial plica is noted.  Popliteal Fossa:  No Baker's cyst.  Extensor Mechanism:  Intact.  Bones:  Normal marrow signal throughout.  Other: None.  IMPRESSION: Complex tear posterior horn medial meniscus.  Mild medial and patellofemoral degenerative change. Medial plica is noted.   Electronically Signed   By: Drusilla Kanner M.D.   On: 08/26/2017 09:39    PMFS History: Patient Active Problem List   Diagnosis Date Noted  . S/P lumbar discectomy 09/25/2014   Past Medical History:  Diagnosis Date  . Anxiety    PTSD  . Diabetes mellitus without complication (HCC)    pt states he is diet controlled and no longer requires meds   . Fibromyalgia   . Headache   . Hepatitis    as a child  . Hypertension     No family history on file.  Past Surgical History:  Procedure Laterality Date  . BACK SURGERY    . CHOLECYSTECTOMY    . HERNIA REPAIR    . LUMBAR LAMINECTOMY/DECOMPRESSION MICRODISCECTOMY Right 09/25/2014   Procedure: Right L4-5 Microdiscectomy for Recurrent Herniated Nucleus Pulposus;  Surgeon: Eldred Manges, MD;  Location: [facility] OR;  Service: Orthopedics;  Laterality: Right;  . surgery to remove glass from neck    . TONSILLECTOMY     Social History   Occupational History  . Not on file  Tobacco Use  . Smoking status: Former Smoker    Last attempt to quit: 12/17/2011    Years since quitting: 5.7  .  Smokeless tobacco: Never Used  Substance and Sexual Activity  . Alcohol use: Yes    Comment: occ  . Drug use: No  . Sexual activity: Not on file

## 2017-09-04 ENCOUNTER — Ambulatory Visit (INDEPENDENT_AMBULATORY_CARE_PROVIDER_SITE_OTHER): Payer: BC Managed Care – PPO | Admitting: Orthopaedic Surgery

## 2017-09-11 ENCOUNTER — Other Ambulatory Visit: Payer: Self-pay

## 2017-09-11 ENCOUNTER — Encounter (HOSPITAL_COMMUNITY): Payer: Self-pay | Admitting: *Deleted

## 2017-09-11 MED ORDER — SCOPOLAMINE 1 MG/3DAYS TD PT72
1.0000 | MEDICATED_PATCH | Freq: Once | TRANSDERMAL | Status: DC | PRN
  Administered 2017-09-12: 1.5 mg via TRANSDERMAL
  Filled 2017-09-11: qty 1

## 2017-09-11 MED ORDER — FENTANYL CITRATE (PF) 100 MCG/2ML IJ SOLN
50.0000 ug | INTRAMUSCULAR | Status: DC | PRN
  Administered 2017-09-12 (×2): 25 ug via INTRAVENOUS
  Filled 2017-09-11: qty 2

## 2017-09-11 MED ORDER — MIDAZOLAM HCL 2 MG/2ML IJ SOLN
1.0000 mg | INTRAMUSCULAR | Status: DC | PRN
  Filled 2017-09-11: qty 2

## 2017-09-11 MED ORDER — LACTATED RINGERS IV SOLN
INTRAVENOUS | Status: DC
  Administered 2017-09-12: 11:00:00 via INTRAVENOUS

## 2017-09-11 NOTE — Progress Notes (Signed)
Mr Frank Green has pre- diabetes, diet controlled,he does not check CBG any more.  I  Requested labs from Dr Jeannetta NapElkins office.

## 2017-09-12 ENCOUNTER — Encounter (HOSPITAL_COMMUNITY): Payer: Self-pay

## 2017-09-12 ENCOUNTER — Ambulatory Visit (HOSPITAL_COMMUNITY): Payer: BC Managed Care – PPO

## 2017-09-12 ENCOUNTER — Encounter (HOSPITAL_COMMUNITY)
Admission: RE | Disposition: A | Discharge status: Home / Self Care | Payer: Self-pay | Source: Ambulatory Visit | Attending: Orthopaedic Surgery

## 2017-09-12 ENCOUNTER — Ambulatory Visit (HOSPITAL_COMMUNITY)
Admission: RE | Admit: 2017-09-12 | Discharge: 2017-09-12 | Disposition: A | Discharge status: Home / Self Care | Payer: BC Managed Care – PPO | Source: Ambulatory Visit | Attending: Orthopaedic Surgery | Admitting: Orthopaedic Surgery

## 2017-09-12 DIAGNOSIS — Z87891 Personal history of nicotine dependence: Secondary | ICD-10-CM | POA: Insufficient documentation

## 2017-09-12 DIAGNOSIS — S83231A Complex tear of medial meniscus, current injury, right knee, initial encounter: Secondary | ICD-10-CM | POA: Diagnosis not present

## 2017-09-12 DIAGNOSIS — X501XXA Overexertion from prolonged static or awkward postures, initial encounter: Secondary | ICD-10-CM | POA: Insufficient documentation

## 2017-09-12 DIAGNOSIS — E119 Type 2 diabetes mellitus without complications: Secondary | ICD-10-CM | POA: Insufficient documentation

## 2017-09-12 DIAGNOSIS — E669 Obesity, unspecified: Secondary | ICD-10-CM | POA: Insufficient documentation

## 2017-09-12 DIAGNOSIS — S83241D Other tear of medial meniscus, current injury, right knee, subsequent encounter: Secondary | ICD-10-CM

## 2017-09-12 DIAGNOSIS — Z6833 Body mass index (BMI) 33.0-33.9, adult: Secondary | ICD-10-CM | POA: Insufficient documentation

## 2017-09-12 DIAGNOSIS — I10 Essential (primary) hypertension: Secondary | ICD-10-CM | POA: Diagnosis not present

## 2017-09-12 DIAGNOSIS — S83249A Other tear of medial meniscus, current injury, unspecified knee, initial encounter: Secondary | ICD-10-CM | POA: Diagnosis present

## 2017-09-12 HISTORY — DX: Prediabetes: R73.03

## 2017-09-12 HISTORY — DX: Post-traumatic stress disorder, unspecified: F43.10

## 2017-09-12 HISTORY — DX: Other meniscus derangements, anterior horn of medial meniscus, right knee: M23.311

## 2017-09-12 HISTORY — PX: KNEE ARTHROSCOPY WITH MEDIAL MENISECTOMY: SHX5651

## 2017-09-12 LAB — SURGICAL PCR SCREEN
MRSA, PCR: NEGATIVE
Staphylococcus aureus: NEGATIVE

## 2017-09-12 LAB — CBC
HEMATOCRIT: 46.3 % (ref 39.0–52.0)
Hemoglobin: 15.4 g/dL (ref 13.0–17.0)
MCH: 28.3 pg (ref 26.0–34.0)
MCHC: 33.3 g/dL (ref 30.0–36.0)
MCV: 85.1 fL (ref 78.0–100.0)
Platelets: 221 10*3/uL (ref 150–400)
RBC: 5.44 MIL/uL (ref 4.22–5.81)
RDW: 13 % (ref 11.5–15.5)
WBC: 7.2 10*3/uL (ref 4.0–10.5)

## 2017-09-12 LAB — BASIC METABOLIC PANEL
Anion gap: 11 (ref 5–15)
BUN: 11 mg/dL (ref 6–20)
CHLORIDE: 106 mmol/L (ref 98–111)
CO2: 23 mmol/L (ref 22–32)
CREATININE: 0.9 mg/dL (ref 0.61–1.24)
Calcium: 9.3 mg/dL (ref 8.9–10.3)
GFR calc non Af Amer: >60 mL/min (ref >60)
Glucose, Bld: 142 mg/dL — ABNORMAL HIGH (ref 70–99)
POTASSIUM: 4.2 mmol/L (ref 3.5–5.1)
SODIUM: 140 mmol/L (ref 135–145)

## 2017-09-12 LAB — HEMOGLOBIN A1C
Hgb A1c MFr Bld: 7.3 % — ABNORMAL HIGH (ref 4.8–5.6)
MEAN PLASMA GLUCOSE: 162.81 mg/dL

## 2017-09-12 SURGERY — ARTHROSCOPY, KNEE, WITH MEDIAL MENISCECTOMY
Anesthesia: General | Site: Knee | Laterality: Right

## 2017-09-12 MED ORDER — LIDOCAINE HCL (CARDIAC) PF 100 MG/5ML IV SOSY
PREFILLED_SYRINGE | INTRAVENOUS | Status: DC | PRN
  Administered 2017-09-12: 100 mg via INTRAVENOUS

## 2017-09-12 MED ORDER — FENTANYL CITRATE (PF) 250 MCG/5ML IJ SOLN
INTRAMUSCULAR | Status: AC
  Filled 2017-09-12: qty 5

## 2017-09-12 MED ORDER — DIAZEPAM 5 MG PO TABS
5.0000 mg | ORAL_TABLET | Freq: Once | ORAL | Status: AC
  Administered 2017-09-12: 5 mg via ORAL

## 2017-09-12 MED ORDER — ONDANSETRON HCL 4 MG/2ML IJ SOLN
INTRAMUSCULAR | Status: AC
  Filled 2017-09-12: qty 2

## 2017-09-12 MED ORDER — BUPIVACAINE-EPINEPHRINE 0.25% -1:200000 IJ SOLN
INTRAMUSCULAR | Status: DC | PRN
  Administered 2017-09-12: 20 mL

## 2017-09-12 MED ORDER — DEXAMETHASONE SODIUM PHOSPHATE 10 MG/ML IJ SOLN
INTRAMUSCULAR | Status: DC | PRN
  Administered 2017-09-12: 10 mg via INTRAVENOUS

## 2017-09-12 MED ORDER — DEXAMETHASONE SODIUM PHOSPHATE 10 MG/ML IJ SOLN
INTRAMUSCULAR | Status: AC
  Filled 2017-09-12: qty 1

## 2017-09-12 MED ORDER — LACTATED RINGERS IV SOLN: INTRAVENOUS | Status: DC

## 2017-09-12 MED ORDER — BUPIVACAINE-EPINEPHRINE (PF) 0.25% -1:200000 IJ SOLN
INTRAMUSCULAR | Status: AC
  Filled 2017-09-12: qty 30

## 2017-09-12 MED ORDER — MIDAZOLAM HCL 5 MG/5ML IJ SOLN
INTRAMUSCULAR | Status: DC | PRN
  Administered 2017-09-12: 2 mg via INTRAVENOUS

## 2017-09-12 MED ORDER — FENTANYL CITRATE (PF) 100 MCG/2ML IJ SOLN
INTRAMUSCULAR | Status: AC
  Filled 2017-09-12: qty 2

## 2017-09-12 MED ORDER — OXYCODONE HCL 5 MG PO TABS
ORAL_TABLET | ORAL | Status: AC
  Filled 2017-09-12: qty 1

## 2017-09-12 MED ORDER — CHLORHEXIDINE GLUCONATE 4 % EX LIQD: 60.0000 mL | Freq: Once | CUTANEOUS | Status: DC

## 2017-09-12 MED ORDER — PROPOFOL 10 MG/ML IV BOLUS
INTRAVENOUS | Status: AC
  Filled 2017-09-12: qty 20

## 2017-09-12 MED ORDER — CEFAZOLIN SODIUM-DEXTROSE 2-4 GM/100ML-% IV SOLN
2.0000 g | INTRAVENOUS | Status: AC
  Administered 2017-09-12: 2 g via INTRAVENOUS

## 2017-09-12 MED ORDER — OXYCODONE HCL 5 MG/5ML PO SOLN: 5.0000 mg | Freq: Once | ORAL | Status: AC | PRN

## 2017-09-12 MED ORDER — MIDAZOLAM HCL 2 MG/2ML IJ SOLN
INTRAMUSCULAR | Status: AC
  Filled 2017-09-12: qty 2

## 2017-09-12 MED ORDER — LIDOCAINE 2% (20 MG/ML) 5 ML SYRINGE
INTRAMUSCULAR | Status: AC
  Filled 2017-09-12: qty 5

## 2017-09-12 MED ORDER — PROPOFOL 10 MG/ML IV BOLUS
INTRAVENOUS | Status: DC | PRN
  Administered 2017-09-12 (×2): 100 mg via INTRAVENOUS

## 2017-09-12 MED ORDER — SODIUM CHLORIDE 0.9 % IR SOLN
Status: DC | PRN
  Administered 2017-09-12: 1200 mL

## 2017-09-12 MED ORDER — FENTANYL CITRATE (PF) 100 MCG/2ML IJ SOLN
25.0000 ug | INTRAMUSCULAR | Status: DC | PRN
  Administered 2017-09-12 (×2): 50 ug via INTRAVENOUS

## 2017-09-12 MED ORDER — PROMETHAZINE HCL 25 MG/ML IJ SOLN: 6.2500 mg | INTRAMUSCULAR | Status: DC | PRN

## 2017-09-12 MED ORDER — DIAZEPAM 5 MG PO TABS
ORAL_TABLET | ORAL | Status: AC
  Administered 2017-09-12: 5 mg via ORAL
  Filled 2017-09-12: qty 1

## 2017-09-12 MED ORDER — OXYCODONE HCL 5 MG PO TABS
5.0000 mg | ORAL_TABLET | Freq: Once | ORAL | Status: AC | PRN
  Administered 2017-09-12: 5 mg via ORAL

## 2017-09-12 MED ORDER — HYDROCODONE-ACETAMINOPHEN 10-325 MG PO TABS: 1.0000 | ORAL_TABLET | Freq: Four times a day (QID) | ORAL | 0 refills | Status: AC | PRN

## 2017-09-12 MED ORDER — 0.9 % SODIUM CHLORIDE (POUR BTL) OPTIME
TOPICAL | Status: DC | PRN
  Administered 2017-09-12: 1000 mL

## 2017-09-12 MED ORDER — CEFAZOLIN SODIUM-DEXTROSE 2-4 GM/100ML-% IV SOLN
INTRAVENOUS | Status: AC
  Filled 2017-09-12: qty 100

## 2017-09-12 SURGICAL SUPPLY — 38 items
APL SKNCLS STERI-STRIP NONHPOA (GAUZE/BANDAGES/DRESSINGS) ×1
BANDAGE ACE 6X5 VEL STRL LF (GAUZE/BANDAGES/DRESSINGS) ×5 IMPLANT
BENZOIN TINCTURE PRP APPL 2/3 (GAUZE/BANDAGES/DRESSINGS) ×3 IMPLANT
BLADE CUDA 5.5 (BLADE) IMPLANT
BLADE EXCALIBUR 4.0MM X 13CM (MISCELLANEOUS) ×1
BLADE EXCALIBUR 4.0X13 (MISCELLANEOUS) ×1 IMPLANT
BLADE GREAT WHITE 4.2 (BLADE) ×2 IMPLANT
BLADE GREAT WHITE 4.2MM (BLADE) ×1
BOOTCOVER CLEANROOM LRG (PROTECTIVE WEAR) ×6 IMPLANT
BUR OVAL 6.0 (BURR) IMPLANT
CLOSURE WOUND 1/2 X4 (GAUZE/BANDAGES/DRESSINGS) ×1
COVER SURGICAL LIGHT HANDLE (MISCELLANEOUS) ×3 IMPLANT
CUFF TOURNIQUET SINGLE 34IN LL (TOURNIQUET CUFF) IMPLANT
CUFF TOURNIQUET SINGLE 44IN (TOURNIQUET CUFF) IMPLANT
DRAPE ARTHROSCOPY W/POUCH 114 (DRAPES) ×3 IMPLANT
DRSG PAD ABDOMINAL 8X10 ST (GAUZE/BANDAGES/DRESSINGS) ×3 IMPLANT
DRSG TEGADERM 4X4.75 (GAUZE/BANDAGES/DRESSINGS) ×2 IMPLANT
DURAPREP 26ML APPLICATOR (WOUND CARE) ×3 IMPLANT
FLUID NSS /IRRIG 3000 ML XXX (IV SOLUTION) ×8 IMPLANT
GAUZE SPONGE 4X4 12PLY STRL (GAUZE/BANDAGES/DRESSINGS) ×3 IMPLANT
GAUZE XEROFORM 1X8 LF (GAUZE/BANDAGES/DRESSINGS) ×3 IMPLANT
GLOVE BIOGEL PI IND STRL 8 (GLOVE) ×1 IMPLANT
GLOVE BIOGEL PI INDICATOR 8 (GLOVE) ×6
GLOVE ORTHO TXT STRL SZ7.5 (GLOVE) ×5 IMPLANT
GOWN STRL REUS W/ TWL LRG LVL3 (GOWN DISPOSABLE) ×2 IMPLANT
GOWN STRL REUS W/TWL 2XL LVL3 (GOWN DISPOSABLE) IMPLANT
GOWN STRL REUS W/TWL LRG LVL3 (GOWN DISPOSABLE) ×6
KIT TURNOVER KIT B (KITS) ×3 IMPLANT
NDL HYPO 25GX1X1/2 BEV (NEEDLE) IMPLANT
NEEDLE HYPO 25GX1X1/2 BEV (NEEDLE) IMPLANT
PACK ARTHROSCOPY DSU (CUSTOM PROCEDURE TRAY) ×3 IMPLANT
PAD ARMBOARD 7.5X6 YLW CONV (MISCELLANEOUS) ×6 IMPLANT
PADDING CAST COTTON 6X4 STRL (CAST SUPPLIES) ×3 IMPLANT
SET ARTHROSCOPY TUBING (MISCELLANEOUS) ×3
SET ARTHROSCOPY TUBING LN (MISCELLANEOUS) ×1 IMPLANT
SPONGE LAP 4X18 RFD (DISPOSABLE) ×3 IMPLANT
STRIP CLOSURE SKIN 1/2X4 (GAUZE/BANDAGES/DRESSINGS) ×2 IMPLANT
TOWEL OR 17X24 6PK STRL BLUE (TOWEL DISPOSABLE) ×6 IMPLANT

## 2017-09-12 NOTE — Progress Notes (Signed)
Orthopedic Tech Progress Note Patient Details:  Frank Green 1965-07-30 [id number]  Patient ID: Frank Green, male   DOB: 1965-07-30, 52 y.o.   MRN: [id number]   Nikki DomCrawford, Imanuel Pruiett 09/12/2017, 4:24 PM Viewed order from doctor's order list

## 2017-09-12 NOTE — Anesthesia Postprocedure Evaluation (Signed)
Anesthesia Post Note  Patient: [name]  Procedure(s) Performed: Right Knee Arthrosocpy and Chondroplasty and Partial  MEDIAL MENISECTOMY (Right Knee)     Patient location during evaluation: PACU Anesthesia Type: General Level of consciousness: awake and alert Pain management: pain level controlled Vital Signs Assessment: post-procedure vital signs reviewed and stable Respiratory status: spontaneous breathing, nonlabored ventilation, respiratory function stable and patient connected to nasal cannula oxygen Cardiovascular status: blood pressure returned to baseline and stable Postop Assessment: no apparent nausea or vomiting Anesthetic complications: no    Last Vitals:  Vitals:   09/12/17 1500 09/12/17 1503  BP:    Pulse: 62 74  Resp: (!) 9 15  Temp:  (!) 36.3 C  SpO2: 94% 97%    Last Pain:  Vitals:   09/12/17 1445  TempSrc:   PainSc: 8                  Katiana Ruland

## 2017-09-12 NOTE — Anesthesia Preprocedure Evaluation (Addendum)
Anesthesia Evaluation  Patient identified by MRN, date of birth, ID band Patient awake    Reviewed: Allergy & Precautions, NPO status , Patient's Chart, lab work & pertinent test results, reviewed documented beta blocker date and time   History of Anesthesia Complications Negative for: history of anesthetic complications  Airway Mallampati: III  TM Distance: >3 FB Neck ROM: Full    Dental  (+) Edentulous Upper, Edentulous Lower   Pulmonary former smoker,    breath sounds clear to auscultation       Cardiovascular hypertension, Pt. on medications and Pt. on home beta blockers  Rhythm:Regular Rate:Normal     Neuro/Psych  Headaches, Anxiety    GI/Hepatic negative GI ROS, Neg liver ROS,   Endo/Other   Obesity Pre-DM   Renal/GU negative Renal ROS  negative genitourinary   Musculoskeletal  (+) Fibromyalgia -  Abdominal   Peds  Hematology negative hematology ROS (+)   Anesthesia Other Findings   Reproductive/Obstetrics                            Anesthesia Physical Anesthesia Plan  ASA: II  Anesthesia Plan: General   Post-op Pain Management:    Induction:   PONV Risk Score and Plan: 3 and Treatment may vary due to age or medical condition, Ondansetron, Dexamethasone and Midazolam  Airway Management Planned: LMA  Additional Equipment: None  Intra-op Plan:   Post-operative Plan: Extubation in OR  Informed Consent: I have reviewed the patients History and Physical, chart, labs and discussed the procedure including the risks, benefits and alternatives for the proposed anesthesia with the patient or authorized representative who has indicated his/her understanding and acceptance.   Dental advisory given  Plan Discussed with: CRNA and Anesthesiologist  Anesthesia Plan Comments:         Anesthesia Quick Evaluation

## 2017-09-12 NOTE — Progress Notes (Signed)
Orthopedic Tech Progress Note Patient Details:  Frank LarocheBruce W Green [birth year] [id number]  Ortho Devices Type of Ortho Device: Crutches Ortho Device/Splint Interventions: Application   Post Interventions Patient Tolerated: Well Instructions Provided: Care of device   Nikki DomCrawford, Trong Gosling 09/12/2017, 4:23 PM

## 2017-09-12 NOTE — H&P (Signed)
History and PE  Assessment & Plan: Visit Diagnoses:  1. 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  He also has a daughter that he is can have to move and to college mid August.  He continues to have catching locking symptoms with complex posterior medial meniscal tear.  He does have some mild medial compartment degenerative changes and minimal leaking noted on MRI scan but is having symptoms from meniscal fragment which is displaced with mechanical catching.  We discussed length of time out of work usually in the 2 to 3-week range for climbing in and out of bands or bus.  Questions were elicited and answered he understands request to proceed.  Follow-Up Instructions: No follow-ups on file.   Orders:  No orders of the defined types were placed in this encounter.  No orders of the defined types were placed in this encounter.     Procedures: No procedures performed   Clinical Data: No additional findings.   Subjective:     Chief Complaint  Patient presents with  . Right Knee - Pain, Follow-up    MRI review    HPI 51 year old male returns with ongoing pain with ambulation in his right knee.  It bothers him at night when he rolls over he has to sleep with a pillow between his knees.  He is ambulating with a cane and states at times his knee is catching and locking with pain medial joint line right knee posteriorly.  Denies left knee problems.  No chills or fever.  He has been on activity modification, anti-inflammatories using ice heat topical creams and also tramadol without relief.  Tramadol bothered his stomach and is now stopped it.  At times with his knee flexed it locks is had difficulty getting it extended after juggling his knee some but does break loose and is able to get the knee  back in extension.  He is using a cane with ambulation and has not driven since 08/21/2017.  Review of Systems previous recurrent disc surgery 2016 doing well at L4-5 on the right.  He does have hypertension takes atenolol.  Cardiovascular respiratory GI GU negative.  14 point review of systems updated and negative as pertains HPI.   Objective: Vital Signs: BP (!) 150/93   Pulse 61   Ht 5\' 10"  (1.778 m)   Wt 230 lb (104.3 kg)   BMI 33.00 kg/m   Physical Exam  Constitutional: He is oriented to person, place, and time. He appears well-developed and well-nourished.  HENT:  Head: Normocephalic and atraumatic.  Eyes: Pupils are equal, round, and reactive to light. EOM are normal.  Neck: No tracheal deviation present. No thyromegaly present.  Cardiovascular: Normal rate.  Pulmonary/Chest: Effort normal. He has no wheezes.  Abdominal: Soft. Bowel sounds are normal.  Neurological: He is alert and oriented to person, place, and time.  Skin: Skin is warm and dry. Capillary refill takes less than 2 seconds.  Psychiatric: He has a normal mood and affect. His behavior is normal. Judgment and thought content normal.    Ortho Exam well-healed lumbar incision at L4-5, anterior tib gastrocsoleus is intact.  He has severe posterior medial joint line tenderness the right knee no lateral joint line tenderness 2+ knee effusion.  With flexion he has severe pain after flexion 110  degrees points to the posterior medial joint line.  Distal pulses are 2+ sensation in the foot is intact.  Negative logroll to the hips.  Specialty Comments:  No specialty comments available.  Imaging: CLINICAL DATA: Right knee pain since a twisting injury approximately 3 months ago.  EXAM: MRI OF THE RIGHT KNEE WITHOUT CONTRAST  TECHNIQUE: Multiplanar, multisequence MR imaging of the knee was performed. No intravenous contrast was administered.  COMPARISON: Plain films right knee  08/25/2017  FINDINGS: MENISCI  Medial meniscus: Complex tear is seen in the posterior horn. No displaced fragment.  Lateral meniscus: Intact.  LIGAMENTS  Cruciates: Intact.  Collaterals: Intact.  CARTILAGE  Patellofemoral: Minimally degenerated.  Medial: Minimally degenerated.  Lateral: Preserved.  Joint: Small joint effusion. Medial plica is noted.  Popliteal Fossa: No Baker's cyst.  Extensor Mechanism: Intact.  Bones: Normal marrow signal throughout.  Other: None.  IMPRESSION: Complex tear posterior horn medial meniscus.  Mild medial and patellofemoral degenerative change. Medial plica is noted.   Electronically Signed By: Drusilla Kannerhomas Dalessio M.D. On: 08/26/2017 09:39    PMFS History:     Patient Active Problem List   Diagnosis Date Noted  . S/P lumbar discectomy 09/25/2014       Past Medical History:  Diagnosis Date  . Anxiety    PTSD  . Diabetes mellitus without complication (HCC)    pt states he is diet controlled and no longer requires meds   . Fibromyalgia   . Headache   . Hepatitis    as a child  . Hypertension     No family history on file.       Past Surgical History:  Procedure Laterality Date  . BACK SURGERY    . CHOLECYSTECTOMY    . HERNIA REPAIR    . LUMBAR LAMINECTOMY/DECOMPRESSION MICRODISCECTOMY Right 09/25/2014   Procedure: Right L4-5 Microdiscectomy for Recurrent Herniated Nucleus Pulposus;  Surgeon: Eldred MangesMark C Zaryah Seckel, MD;  [facility];  Service: Orthopedics;  Laterality: Right;  . surgery to remove glass from neck    . TONSILLECTOMY     Social History        Occupational History  . Not on file  Tobacco Use  . Smoking status: Former Smoker    Last attempt to quit: 12/17/2011    Years since quitting: 5.7  . Smokeless tobacco: Never Used  Substance and Sexual Activity  . Alcohol use: Yes    Comment: occ  . Drug use: No  . Sexual activity: Not on  file

## 2017-09-12 NOTE — Op Note (Signed)
Preop diagnosis: Right knee posterior medial meniscal tear  Postop diagnosis: Same  Procedure: Right knee arthroscopy, partial posterior medial meniscectomy, patellofemoral chondroplasty (not down to bone)  Surgeon: Annell GreeningMark Isai Gottlieb, MD  Assistant: None  Anesthesia General LMA  Complications: None  Tourniquet: None.  52 year old male with persistent catching pain swelling not responded active to anti-inflammatories and intra-articular cortisone injection.  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  Procedure after standard prepping and draping timeout procedure proximal leg holder impervious stockinette Coban and DuraPrep arthroscopic sheets and drapes were applied.  Inflow was placed through superior lateral portal medial lateral parapatellar Pinn of tendon portals were used for scope and probe placement.  Patellofemoral joint was inspected there was some grade 3 changes on the patella midportion which was smoothed with a shaver and extensive grade 3 flap tear changes in the trochlear groove which was trimmed switching portals using the 4.2 Cuda shaver.  Lateral compartment showed intact meniscus no cartilage wear.  ACL PCL was normal.  Anterior portion of the medial meniscus was intact in the posterior portion of the medial meniscus showed a complex tear with a fragment that would partially subluxed into the joint but not quite far enough to cause a locking.  Using a small straight flat basket and 4.2 Cuda shaver partial posterior medial meniscectomy was performed.  This was resected back to the meniscocapsular junction taking two thirds of the radius of the meniscus in this area.  Midportion of the medial meniscus and anterior and was carefully probed and were intact.  There is some area of wear on the end of the femoral condyle mild there is a grade 3 changes which were trimmed and smooth.  Remaining meniscus  was carefully probed lateral compartments checked again trochlear groove was smooth one final time knee was irrigated copiously suctioned dry tincture benzoin Steri-Strip Tegaderm Marcaine infiltration a total of 20 cc 4 x 4's ABD web roll and Ace wrap x2 was applied postoperatively.

## 2017-09-12 NOTE — Interval H&P Note (Signed)
History and Physical Interval Note:  09/12/2017 12:34 PM  Frank Green  has presented today for surgery, with the diagnosis of right complex medial meniscal tear  The various methods of treatment have been discussed with the patient and family. After consideration of risks, benefits and other options for treatment, the patient has consented to  Procedure(s): RIGHT KNEE ARTHROSCOPY WITH MEDIAL MENISECTOMY (Right) as a surgical intervention .  The patient's history has been reviewed, patient examined, no change in status, stable for surgery.  I have reviewed the patient's chart and labs.  Questions were answered to the patient's satisfaction.     Eldred MangesMark C Kristain Hu

## 2017-09-12 NOTE — Transfer of Care (Signed)
Immediate Anesthesia Transfer of Care Note  Patient: Frank Green  Procedure(s) Performed: Right Knee Arthrosocpy and Chondroplasty and Partial  MEDIAL MENISECTOMY (Right Knee)  Patient Location: PACU  Anesthesia Type:General  Level of Consciousness: drowsy  Airway & Oxygen Therapy: Patient Spontanous Breathing and Patient connected to nasal cannula oxygen  Post-op Assessment: Report given to RN and Post -op Vital signs reviewed and stable  Post vital signs: Reviewed and stable  Last Vitals:  Vitals Value Taken Time  BP    Temp    Pulse 77 09/12/2017  1:43 PM  Resp 15 09/12/2017  1:43 PM  SpO2 92 % 09/12/2017  1:43 PM  Vitals shown include unvalidated device data.  Last Pain:  Vitals:   09/12/17 1034  TempSrc: Oral  PainSc: 4       Patients Stated Pain Goal: 3 (09/12/17 1034)  Complications: No apparent anesthesia complications

## 2017-09-12 NOTE — Anesthesia Procedure Notes (Signed)
Procedure Name: LMA Insertion Date/Time: 09/12/2017 12:51 PM Performed by: Adonis Housekeeperongell, Rhylan Kagel M, CRNA Pre-anesthesia Checklist: Patient identified, Emergency Drugs available, Suction available, Patient being monitored and Timeout performed Patient Re-evaluated:Patient Re-evaluated prior to induction Oxygen Delivery Method: Circle system utilized Preoxygenation: Pre-oxygenation with 100% oxygen Induction Type: IV induction Ventilation: Mask ventilation without difficulty LMA: LMA inserted LMA Size: 5.0 Number of attempts: 1 Tube secured with: Tape Dental Injury: Teeth and Oropharynx as per pre-operative assessment

## 2017-09-13 ENCOUNTER — Encounter (HOSPITAL_COMMUNITY): Payer: Self-pay | Admitting: Orthopaedic Surgery

## 2017-09-18 ENCOUNTER — Encounter (INDEPENDENT_AMBULATORY_CARE_PROVIDER_SITE_OTHER): Payer: Self-pay | Admitting: Orthopaedic Surgery

## 2017-09-18 ENCOUNTER — Ambulatory Visit (INDEPENDENT_AMBULATORY_CARE_PROVIDER_SITE_OTHER): Payer: BC Managed Care – PPO | Admitting: Orthopaedic Surgery

## 2017-09-18 VITALS — BP 140/97 | HR 70 | Ht 70.0 in | Wt 230.0 lb

## 2017-09-18 DIAGNOSIS — S83241D Other tear of medial meniscus, current injury, right knee, subsequent encounter: Secondary | ICD-10-CM

## 2017-09-18 NOTE — Progress Notes (Addendum)
   Post-Op Visit Note   Patient: Frank Green           Date of Birth: [birth year]           MRN: [id number] Visit Date: 09/18/2017 PCP: Kaleen MaskElkins, Wilson Oliver, MD   Assessment & Plan: Gilmore Larocheost knee arthroscopy with chondromalacia patellofemoral joint grade 3 changes which were debrided.  He had a posterior medial meniscal tear we reviewed the operative photos.  Chief Complaint:  Chief Complaint  Patient presents with  . Right Knee - Routine Post Op   Visit Diagnoses:  1. Acute medial meniscus tear of right knee, subsequent encounter     Plan: Patient can continue the Aleve 2 twice a day.  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  We discussed knee flexion exercises straight leg raising and working on knee extension and practiced these with him.  Recheck 1 month.  Follow-Up Instructions: Return in about 1 month (around 10/19/2017).   Orders:  No orders of the defined types were placed in this encounter.  No orders of the defined types were placed in this encounter.   Imaging: No results found.  PMFS History: Patient Active Problem List   Diagnosis Date Noted  . Acute medial meniscal tear 09/12/2017  . S/P lumbar discectomy 09/25/2014   Past Medical History:  Diagnosis Date  . Anxiety    PTSD  . Fibromyalgia   . Headache   . Hepatitis    as a child  . Hypertension   . Medial meniscus, anterior horn derangement, right   . Pre-diabetes   . PTSD (post-traumatic stress disorder)     History reviewed. No pertinent family history.  Past Surgical History:  Procedure Laterality Date  . BACK SURGERY     Lumber lam- 4-5  . CHOLECYSTECTOMY    . HERNIA REPAIR Bilateral   . KNEE ARTHROSCOPY WITH MEDIAL MENISECTOMY Right 09/12/2017   Procedure: Right Knee Arthrosocpy and Chondroplasty and Partial  MEDIAL MENISECTOMY;   Surgeon: Eldred MangesYates, Paul Trettin C, MD;  [facility];  Service: Orthopedics;  Laterality: Right;  . 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 surgery to remove glass from neck    . TONSILLECTOMY     Social History   Occupational History  . Not on file  Tobacco Use  . Smoking status: Former Smoker    Years: 33.00    Last attempt to quit: 12/17/2011    Years since quitting: 5.7  . Smokeless tobacco: Never Used  Substance and Sexual Activity  . Alcohol use: Yes    Comment: occ  . Drug use: No  . Sexual activity: Not on file

## 2017-10-03 ENCOUNTER — Telehealth (INDEPENDENT_AMBULATORY_CARE_PROVIDER_SITE_OTHER): Payer: Self-pay | Admitting: Orthopaedic Surgery

## 2017-10-03 NOTE — Telephone Encounter (Signed)
I left voicemail for patient to return my call in regards to calf pain.

## 2017-10-03 NOTE — Telephone Encounter (Signed)
Patients wife called requesting a muscle relaxer be called in for her husband. He had right knee surgery 7/24 and recently the top of his calf muscle has been tightening up. She believes it could be from him still walking on tip toes. She also states he is doing really well besides this muscle giving him a problem. [facility] uses [facility] [place]. Please call patient # [phone]

## 2017-10-03 NOTE — Telephone Encounter (Signed)
I spoke with patient. He states that his knee is doing well, but he is having calf pain.  He feels like it is coming from where he is walking on his toes some to compensate for his knee. He states that the pain increases when he stands. His calf is tight and spasms at night. He has tried stretching, ice, and robaxin with no relief.   Patient does not have history of blood clots. He is not taking aspirin.  He is status post Right Knee Arthroscopy and Chondroplasty and Partial MEDIAL MENISECTOMY on 09/12/17.

## 2017-10-03 NOTE — Telephone Encounter (Signed)
Patient returned call asked for a call back.  The number to contact patient is [phone]

## 2017-10-03 NOTE — Telephone Encounter (Signed)
I called discussed.  He is having some problems getting his knee into full extension.  He moved his daughter into college few days ago was doing a lot of walking and carrying.  Altered gait is probably making his Sore.  He is using some topical cream.  We discussed working on some stretching of his posterior capsule of his knee to get his knee in full extension and walk a little bit slower so he can make sure he does heel strike first and does not toe walk which is likely making his calf sore.FYI

## 2017-10-03 NOTE — Telephone Encounter (Signed)
noted 

## 2017-10-03 NOTE — Telephone Encounter (Signed)
Duplicate message. 

## 2017-10-03 NOTE — Telephone Encounter (Signed)
Please see messages below and advise. Would you like for me to order doppler?  Patient would like to know if there is another muscle relaxer he could take?

## 2017-10-12 ENCOUNTER — Encounter (INDEPENDENT_AMBULATORY_CARE_PROVIDER_SITE_OTHER): Payer: Self-pay | Admitting: Orthopaedic Surgery

## 2017-10-12 ENCOUNTER — Ambulatory Visit (INDEPENDENT_AMBULATORY_CARE_PROVIDER_SITE_OTHER): Payer: BC Managed Care – PPO | Admitting: Orthopaedic Surgery

## 2017-10-12 VITALS — BP 145/86 | HR 77 | Ht 70.0 in | Wt 230.0 lb

## 2017-10-12 DIAGNOSIS — S83241D Other tear of medial meniscus, current injury, right knee, subsequent encounter: Secondary | ICD-10-CM

## 2017-10-12 NOTE — Progress Notes (Signed)
   Post-Op Visit Note   Patient: Frank Green           Date of Birth: [birth year]           MRN: [id number] Visit Date: 10/12/2017 PCP: Kaleen MaskElkins, Wilson Oliver, MD   Assessment & Plan: Post right knee arthroscopy.  He is gotten good improvement in his function is Artie back at work.  He has full flexion full extension.  Chief Complaint:  Chief Complaint  Patient presents with  . Right Knee - Follow-up   Visit Diagnoses:  1. Acute medial meniscus tear of right knee, subsequent encounter     Plan: Patient is happy with the surgical result.  He can return on a as needed basis.  Follow-Up Instructions: Return if symptoms worsen or fail to improve.   Orders:  No orders of the defined types were placed in this encounter.  No orders of the defined types were placed in this encounter.   Imaging: No results found.  PMFS History: Patient Active Problem List   Diagnosis Date Noted  . Acute medial meniscal tear 09/12/2017  . S/P lumbar discectomy 09/25/2014   Past Medical History:  Diagnosis Date  . Anxiety    PTSD  . Fibromyalgia   . Headache   . Hepatitis    as a child  . Hypertension   . Medial meniscus, anterior horn derangement, right   . Pre-diabetes   . PTSD (post-traumatic stress disorder)     No family history on file.  Past Surgical History:  Procedure Laterality Date  . BACK SURGERY     Lumber lam- 4-5  . CHOLECYSTECTOMY    . HERNIA REPAIR Bilateral   . KNEE ARTHROSCOPY WITH MEDIAL MENISECTOMY Right 09/12/2017   Procedure: Right Knee Arthrosocpy and Chondroplasty and Partial  MEDIAL MENISECTOMY;  Surgeon: Eldred MangesYates, Miciah Covelli C, MD;  [facility];  Service: Orthopedics;  Laterality: Right;  . 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 surgery to remove glass from neck     . TONSILLECTOMY     Social History   Occupational History  . Not on file  Tobacco Use  . Smoking status: Former Smoker    Years: 33.00    Last attempt to quit: 12/17/2011    Years since quitting: 5.8  . Smokeless tobacco: Never Used  Substance and Sexual Activity  . Alcohol use: Yes    Comment: occ  . Drug use: No  . Sexual activity: Not on file

## 2021-11-09 ENCOUNTER — Ambulatory Visit: Payer: BC Managed Care – PPO | Admitting: Orthopaedic Surgery

## 2022-07-11 ENCOUNTER — Ambulatory Visit: Payer: Medicaid Other | Admitting: Orthopaedic Surgery
